# Patient Record
Sex: Female | Born: 1975 | Race: White | Hispanic: No | Marital: Single | Smoking: Former smoker
Health system: Southern US, Community
[De-identification: ages and names within clinical notes are randomized; demographics above are authoritative.]

## PROBLEM LIST (undated history)

## (undated) DIAGNOSIS — J302 Other seasonal allergic rhinitis: Secondary | ICD-10-CM

## (undated) DIAGNOSIS — Z87891 Personal history of nicotine dependence: Secondary | ICD-10-CM

## (undated) DIAGNOSIS — Z8489 Family history of other specified conditions: Secondary | ICD-10-CM

## (undated) DIAGNOSIS — E162 Hypoglycemia, unspecified: Secondary | ICD-10-CM

## (undated) DIAGNOSIS — Z87442 Personal history of urinary calculi: Secondary | ICD-10-CM

## (undated) DIAGNOSIS — N83202 Unspecified ovarian cyst, left side: Secondary | ICD-10-CM

## (undated) DIAGNOSIS — Z9889 Other specified postprocedural states: Secondary | ICD-10-CM

## (undated) DIAGNOSIS — N83201 Unspecified ovarian cyst, right side: Secondary | ICD-10-CM

## (undated) DIAGNOSIS — R112 Nausea with vomiting, unspecified: Secondary | ICD-10-CM

## (undated) DIAGNOSIS — K219 Gastro-esophageal reflux disease without esophagitis: Secondary | ICD-10-CM

## (undated) DIAGNOSIS — R51 Headache: Secondary | ICD-10-CM

## (undated) HISTORY — PX: AUGMENTATION MAMMAPLASTY: SUR837

## (undated) HISTORY — DX: Personal history of nicotine dependence: Z87.891

## (undated) HISTORY — DX: Other seasonal allergic rhinitis: J30.2

---

## 2000-09-29 ENCOUNTER — Ambulatory Visit (HOSPITAL_COMMUNITY): Admission: RE | Admit: 2000-09-29 | Discharge: 2000-09-29 | Payer: Self-pay | Admitting: Internal Medicine

## 2001-06-13 ENCOUNTER — Emergency Department (HOSPITAL_COMMUNITY): Admission: EM | Admit: 2001-06-13 | Discharge: 2001-06-14 | Payer: Self-pay | Admitting: Emergency Medicine

## 2001-06-14 ENCOUNTER — Encounter: Payer: Self-pay | Admitting: Emergency Medicine

## 2002-10-28 ENCOUNTER — Ambulatory Visit (HOSPITAL_COMMUNITY): Admission: RE | Admit: 2002-10-28 | Discharge: 2002-10-28 | Payer: Self-pay | Admitting: Orthopedic Surgery

## 2002-10-28 ENCOUNTER — Encounter: Payer: Self-pay | Admitting: Orthopedic Surgery

## 2003-10-28 ENCOUNTER — Other Ambulatory Visit: Admission: RE | Admit: 2003-10-28 | Discharge: 2003-10-28 | Payer: Self-pay | Admitting: Gynecology

## 2004-06-12 ENCOUNTER — Emergency Department (HOSPITAL_COMMUNITY): Admission: EM | Admit: 2004-06-12 | Discharge: 2004-06-12 | Payer: Self-pay | Admitting: Emergency Medicine

## 2004-10-29 ENCOUNTER — Other Ambulatory Visit: Admission: RE | Admit: 2004-10-29 | Discharge: 2004-10-29 | Payer: Self-pay | Admitting: Gynecology

## 2005-11-27 ENCOUNTER — Other Ambulatory Visit: Admission: RE | Admit: 2005-11-27 | Discharge: 2005-11-27 | Payer: Self-pay | Admitting: Gynecology

## 2006-12-03 ENCOUNTER — Other Ambulatory Visit: Admission: RE | Admit: 2006-12-03 | Discharge: 2006-12-03 | Payer: Self-pay | Admitting: Gynecology

## 2007-05-04 ENCOUNTER — Emergency Department (HOSPITAL_COMMUNITY): Admission: EM | Admit: 2007-05-04 | Discharge: 2007-05-04 | Payer: Self-pay | Admitting: Emergency Medicine

## 2009-12-20 ENCOUNTER — Emergency Department (HOSPITAL_COMMUNITY): Admission: EM | Admit: 2009-12-20 | Discharge: 2009-12-20 | Payer: Self-pay | Admitting: Family Medicine

## 2010-03-26 ENCOUNTER — Emergency Department (HOSPITAL_COMMUNITY): Admission: EM | Admit: 2010-03-26 | Discharge: 2010-03-26 | Payer: Self-pay | Admitting: Family Medicine

## 2010-06-22 ENCOUNTER — Emergency Department (HOSPITAL_COMMUNITY): Admission: EM | Admit: 2010-06-22 | Discharge: 2010-06-22 | Payer: Self-pay | Admitting: Emergency Medicine

## 2011-01-08 ENCOUNTER — Emergency Department (INDEPENDENT_AMBULATORY_CARE_PROVIDER_SITE_OTHER): Payer: Self-pay

## 2011-01-08 ENCOUNTER — Emergency Department (HOSPITAL_BASED_OUTPATIENT_CLINIC_OR_DEPARTMENT_OTHER)
Admission: EM | Admit: 2011-01-08 | Discharge: 2011-01-08 | Disposition: A | Discharge status: Home / Self Care | Payer: Self-pay | Attending: Emergency Medicine | Admitting: Emergency Medicine

## 2011-01-08 DIAGNOSIS — W19XXXA Unspecified fall, initial encounter: Secondary | ICD-10-CM | POA: Insufficient documentation

## 2011-01-08 DIAGNOSIS — Z043 Encounter for examination and observation following other accident: Secondary | ICD-10-CM

## 2011-01-08 DIAGNOSIS — R51 Headache: Secondary | ICD-10-CM | POA: Insufficient documentation

## 2011-01-08 DIAGNOSIS — S0990XA Unspecified injury of head, initial encounter: Secondary | ICD-10-CM | POA: Insufficient documentation

## 2011-01-08 DIAGNOSIS — R079 Chest pain, unspecified: Secondary | ICD-10-CM | POA: Insufficient documentation

## 2011-01-08 DIAGNOSIS — S1093XA Contusion of unspecified part of neck, initial encounter: Secondary | ICD-10-CM

## 2011-01-08 DIAGNOSIS — F1010 Alcohol abuse, uncomplicated: Secondary | ICD-10-CM

## 2011-01-08 DIAGNOSIS — M549 Dorsalgia, unspecified: Secondary | ICD-10-CM | POA: Insufficient documentation

## 2011-01-08 DIAGNOSIS — S0003XA Contusion of scalp, initial encounter: Secondary | ICD-10-CM

## 2011-01-08 LAB — URINE MICROSCOPIC-ADD ON

## 2011-01-08 LAB — CBC
HCT: 43.3 % (ref 36.0–46.0)
Hemoglobin: 14.9 g/dL (ref 12.0–15.0)
MCH: 29.9 pg (ref 26.0–34.0)
MCHC: 34.4 g/dL (ref 30.0–36.0)
Platelets: 235 10*3/uL (ref 150–400)
WBC: 7 10*3/uL (ref 4.0–10.5)

## 2011-01-08 LAB — URINALYSIS, ROUTINE W REFLEX MICROSCOPIC
Bilirubin Urine: NEGATIVE
Ketones, ur: NEGATIVE mg/dL
Leukocytes, UA: NEGATIVE
Protein, ur: NEGATIVE mg/dL
Urobilinogen, UA: 0.2 mg/dL (ref 0.0–1.0)
pH: 7 (ref 5.0–8.0)

## 2011-01-08 LAB — COMPREHENSIVE METABOLIC PANEL
ALT: 14 U/L (ref 0–35)
AST: 25 U/L (ref 0–37)
Albumin: 4.7 g/dL (ref 3.5–5.2)
CO2: 27 mEq/L (ref 19–32)
Calcium: 9.1 mg/dL (ref 8.4–10.5)
Chloride: 108 mEq/L (ref 96–112)
Creatinine, Ser: 0.8 mg/dL (ref 0.4–1.2)

## 2011-01-08 LAB — POCT TOXICOLOGY PANEL

## 2011-01-08 LAB — DIFFERENTIAL
Basophils Absolute: 0 10*3/uL (ref 0.0–0.1)
Basophils Relative: 0 % (ref 0–1)
Eosinophils Absolute: 0.1 10*3/uL (ref 0.0–0.7)
Eosinophils Relative: 2 % (ref 0–5)
Monocytes Absolute: 0.7 10*3/uL (ref 0.1–1.0)
Monocytes Relative: 10 % (ref 3–12)

## 2011-10-23 DIAGNOSIS — Z87891 Personal history of nicotine dependence: Secondary | ICD-10-CM | POA: Insufficient documentation

## 2011-10-24 ENCOUNTER — Ambulatory Visit (INDEPENDENT_AMBULATORY_CARE_PROVIDER_SITE_OTHER): Payer: Managed Care, Other (non HMO) | Admitting: Women's Health

## 2011-10-24 ENCOUNTER — Other Ambulatory Visit (HOSPITAL_COMMUNITY)
Admission: RE | Admit: 2011-10-24 | Discharge: 2011-10-24 | Disposition: A | Discharge status: Home / Self Care | Payer: Managed Care, Other (non HMO) | Source: Ambulatory Visit | Attending: Obstetrics and Gynecology | Admitting: Obstetrics and Gynecology

## 2011-10-24 ENCOUNTER — Encounter: Payer: Self-pay | Admitting: Women's Health

## 2011-10-24 VITALS — BP 128/80 | Ht 64.25 in | Wt 131.0 lb

## 2011-10-24 DIAGNOSIS — Z01419 Encounter for gynecological examination (general) (routine) without abnormal findings: Secondary | ICD-10-CM | POA: Insufficient documentation

## 2011-10-24 DIAGNOSIS — N926 Irregular menstruation, unspecified: Secondary | ICD-10-CM

## 2011-10-24 LAB — URINALYSIS W MICROSCOPIC + REFLEX CULTURE
Casts: NONE SEEN
Ketones, ur: NEGATIVE mg/dL
Leukocytes, UA: NEGATIVE
Protein, ur: NEGATIVE mg/dL
RBC / HPF: NONE SEEN RBC/hpf (ref <3)
Specific Gravity, Urine: 1.015 (ref 1.005–1.030)
Urobilinogen, UA: 0.2 mg/dL (ref 0.0–1.0)
WBC, UA: NONE SEEN WBC/hpf (ref <3)

## 2011-10-24 LAB — CBC WITH DIFFERENTIAL/PLATELET
Basophils Relative: 1 % (ref 0–1)
Eosinophils Relative: 2 % (ref 0–5)
Hemoglobin: 13.3 g/dL (ref 12.0–15.0)
Monocytes Absolute: 0.5 10*3/uL (ref 0.1–1.0)
Monocytes Relative: 8 % (ref 3–12)
Neutro Abs: 3.6 10*3/uL (ref 1.7–7.7)
Neutrophils Relative %: 57 % (ref 43–77)
Platelets: 211 10*3/uL (ref 150–400)
RBC: 4.38 MIL/uL (ref 3.87–5.11)
RDW: 12.6 % (ref 11.5–15.5)
WBC: 6.3 10*3/uL (ref 4.0–10.5)

## 2011-10-24 LAB — TSH: TSH: 1.256 u[IU]/mL (ref 0.350–4.500)

## 2011-10-24 NOTE — Progress Notes (Signed)
Leslie Keller Jan 29, 1976 [id number]    History:    The patient presents for annual exam.  Monthly 4-5 day cycle has used no contraception for greater than 1-1/2 years without pregnancy. Stopped  Nuva ring greater than 2 years ago, used condoms for one year, now desires conception. Has intercourse approximately 5-6 times per week, partner is healthy, has a 38 year old child. History of normal Paps, same partner many years with negative STD screen.  Past medical history, past surgical history, family history and social history were all reviewed and documented in the EPIC chart. Massage therapist part time, works full time in finance/insurance.   ROS:  A  ROS was performed and pertinent positives and negatives are included in the history.  Exam:  Filed Vitals:   10/24/11 1400  BP: 128/80    General appearance:  Normal Head/Neck:  Normal, without cervical or supraclavicular adenopathy. Thyroid:  Symmetrical, normal in size, without palpable masses or nodularity. Respiratory  Effort:  Normal  Auscultation:  Clear without wheezing or rhonchi Cardiovascular  Auscultation:  Regular rate, without rubs, murmurs or gallops  Edema/varicosities:  Not grossly evident Abdominal  Soft,nontender, without masses, guarding or rebound.  Liver/spleen:  No organomegaly noted  Hernia:  None appreciated  Skin  Inspection:  Grossly normal  Palpation:  Grossly normal Neurologic/psychiatric  Orientation:  Normal with appropriate conversation.  Mood/affect:  Normal  Genitourinary    Breasts: Examined lying and sitting/augmented.     Right: Without masses, retractions, discharge or axillary adenopathy.     Left: Lower/always Without masses, retractions, discharge or axillary adenopathy.   Inguinal/mons:  Normal without inguinal adenopathy  External genitalia:  Normal  BUS/Urethra/Skene's glands:  Normal  Bladder:  Normal  Vagina:  Normal  Cervix:  Normal  Uterus:   normal in size, shape and  contour.  Midline and mobile  Adnexa/parametria:     Rt: Without masses or tenderness.   Lt: Without masses or tenderness.  Anus and perineum: Normal  Digital rectal exam: Normal sphincter tone without palpated masses or tenderness  Assessment/Plan:  36 y.o. SWF G0 for annual exam/ regular 4-5d  monthly cycle.  Desiring pregnancy /No contraception for greater than 1 1/2 years without conception   Plan: CBC, TSH, prolactin, UA and Pap. Continue frequent intercourse, estradiol and FSH on day 3 of cycle, progesterone on day 22-25 of cycle. Reviewed if these tests are normal semen analysis and hysterosalpingogram. If all testing normal return for office visit with Dr. Audie Box for fertility management. Continue prenatal vitamin daily. Is aware of healthy behaviors in pregnancy. SBE's, exercise, calcium rich diet encouraged and continue healthy lifestyle.   Harrington Challenger [facility], 3:46 PM 10/24/2011

## 2011-10-24 NOTE — Patient Instructions (Addendum)
Return to office for estradiol and fsh blood test day 3 of next cycle  Then return day 22-25 for progesterone blood test  Day 1 first day of cycle  Usually ovulate 2 weeks prior to next cycle.    If all blood tests normal then will to [facility]

## 2011-11-07 ENCOUNTER — Other Ambulatory Visit: Payer: Managed Care, Other (non HMO)

## 2011-11-28 ENCOUNTER — Other Ambulatory Visit: Payer: Managed Care, Other (non HMO)

## 2011-11-28 LAB — PROGESTERONE: Progesterone: 10.6 ng/mL

## 2012-10-28 ENCOUNTER — Encounter: Payer: Self-pay | Admitting: Women's Health

## 2012-10-28 ENCOUNTER — Ambulatory Visit (INDEPENDENT_AMBULATORY_CARE_PROVIDER_SITE_OTHER): Payer: Managed Care, Other (non HMO) | Admitting: Women's Health

## 2012-10-28 VITALS — BP 106/70 | Ht 64.5 in | Wt 127.5 lb

## 2012-10-28 DIAGNOSIS — Z01419 Encounter for gynecological examination (general) (routine) without abnormal findings: Secondary | ICD-10-CM

## 2012-10-28 LAB — CBC WITH DIFFERENTIAL/PLATELET
Basophils Absolute: 0 10*3/uL (ref 0.0–0.1)
Eosinophils Absolute: 0.1 10*3/uL (ref 0.0–0.7)
HCT: 40 % (ref 36.0–46.0)
Lymphocytes Relative: 29 % (ref 12–46)
MCH: 31.3 pg (ref 26.0–34.0)
MCHC: 34.5 g/dL (ref 30.0–36.0)
MCV: 90.7 fL (ref 78.0–100.0)
Monocytes Absolute: 0.6 10*3/uL (ref 0.1–1.0)
Neutro Abs: 3.2 10*3/uL (ref 1.7–7.7)
Neutrophils Relative %: 60 % (ref 43–77)
RBC: 4.41 MIL/uL (ref 3.87–5.11)
RDW: 13.2 % (ref 11.5–15.5)
WBC: 5.4 10*3/uL (ref 4.0–10.5)

## 2012-10-28 LAB — RPR

## 2012-10-28 LAB — HIV ANTIBODY (ROUTINE TESTING W REFLEX): HIV: NONREACTIVE

## 2012-10-28 LAB — WET PREP FOR TRICH, YEAST, CLUE
Trich, Wet Prep: NONE SEEN
WBC, Wet Prep HPF POC: NONE SEEN

## 2012-10-28 MED ORDER — METRONIDAZOLE 0.75 % VA GEL: VAGINAL | Status: DC

## 2012-10-28 NOTE — Progress Notes (Signed)
Leslie Keller 11-Feb-1976 [id number]    History:    The patient presents for annual exam.  Monthly 4d cycle/condoms new partner. History of normal Paps.   Past medical history, past surgical history, family history and social history were all reviewed and documented in the EPIC chart. works for [employer]. Body building competitor. Quit smoking 2004.   ROS:  A  ROS was performed and pertinent positives and negatives are included in the history.  Exam:  Filed Vitals:   10/28/12 1222  BP: 106/70    General appearance:  Normal Head/Neck:  Normal, without cervical or supraclavicular adenopathy. Thyroid:  Symmetrical, normal in size, without palpable masses or nodularity. Respiratory  Effort:  Normal  Auscultation:  Clear without wheezing or rhonchi Cardiovascular  Auscultation:  Regular rate, without rubs, murmurs or gallops  Edema/varicosities:  Not grossly evident Abdominal  Soft,nontender, without masses, guarding or rebound.  Liver/spleen:  No organomegaly noted  Hernia:  None appreciated  Skin  Inspection:  Grossly normal  Palpation:  Grossly normal Neurologic/psychiatric  Orientation:  Normal with appropriate conversation.  Mood/affect:  Normal  Genitourinary    Breasts: Examined lying and sitting/saline implants/06.     Right: Without masses, retractions, discharge or axillary adenopathy.     Left: Without masses, retractions, discharge or axillary adenopathy.   Inguinal/mons:  Normal without inguinal adenopathy  External genitalia:  Normal  BUS/Urethra/Skene's glands:  Normal  Bladder:  Normal  Vagina:  Wet prep positive for clue cells and TNTC bacteria.  Cervix:  Normal  Uterus:   normal in size, shape and contour.  Midline and mobile  Adnexa/parametria:     Rt: Without masses or tenderness.   Lt: Without masses or tenderness.  Anus and perineum: Normal  Digital rectal exam: Normal sphincter tone without palpated masses or  tenderness  Assessment/Plan:  37 y.o. SWF G0 for annual exam with complaint of vaginal discharge with odor at times.  BV STD screen  Plan: Contraception options reviewed and declined, will continue condoms, pregnancy okay if occurs. SBE's, continue exercise, calcium rich diet,  multivitamin daily encouraged. CBC, UA, GC/Chlamydia, HIV, hepatitis B., C, RPR. Pap normal 2013, new screening guidelines reviewed. MetroGel vaginal cream 1 applicator at bedtime x5 prescription, alcohol precautions and proper use reviewed. Instructed to call if no relief of symptoms.   Harrington Challenger WHNP, 1:03 PM 10/28/2012

## 2012-10-28 NOTE — Patient Instructions (Addendum)

## 2012-10-29 LAB — URINALYSIS W MICROSCOPIC + REFLEX CULTURE
Bilirubin Urine: NEGATIVE
Casts: NONE SEEN
Glucose, UA: NEGATIVE mg/dL
Hgb urine dipstick: NEGATIVE
Leukocytes, UA: NEGATIVE
Protein, ur: NEGATIVE mg/dL
Urobilinogen, UA: 0.2 mg/dL (ref 0.0–1.0)
pH: 7.5 (ref 5.0–8.0)

## 2013-03-19 ENCOUNTER — Emergency Department (HOSPITAL_BASED_OUTPATIENT_CLINIC_OR_DEPARTMENT_OTHER): Payer: Managed Care, Other (non HMO)

## 2013-03-19 ENCOUNTER — Emergency Department (HOSPITAL_BASED_OUTPATIENT_CLINIC_OR_DEPARTMENT_OTHER)
Admission: EM | Admit: 2013-03-19 | Discharge: 2013-03-19 | Disposition: A | Discharge status: Home / Self Care | Payer: Managed Care, Other (non HMO) | Attending: Emergency Medicine | Admitting: Emergency Medicine

## 2013-03-19 ENCOUNTER — Encounter (HOSPITAL_BASED_OUTPATIENT_CLINIC_OR_DEPARTMENT_OTHER): Payer: Self-pay | Admitting: *Deleted

## 2013-03-19 DIAGNOSIS — N949 Unspecified condition associated with female genital organs and menstrual cycle: Secondary | ICD-10-CM | POA: Insufficient documentation

## 2013-03-19 DIAGNOSIS — R350 Frequency of micturition: Secondary | ICD-10-CM | POA: Insufficient documentation

## 2013-03-19 DIAGNOSIS — Z87891 Personal history of nicotine dependence: Secondary | ICD-10-CM | POA: Insufficient documentation

## 2013-03-19 DIAGNOSIS — R112 Nausea with vomiting, unspecified: Secondary | ICD-10-CM | POA: Insufficient documentation

## 2013-03-19 DIAGNOSIS — R3915 Urgency of urination: Secondary | ICD-10-CM | POA: Insufficient documentation

## 2013-03-19 DIAGNOSIS — N390 Urinary tract infection, site not specified: Secondary | ICD-10-CM | POA: Insufficient documentation

## 2013-03-19 DIAGNOSIS — N760 Acute vaginitis: Secondary | ICD-10-CM | POA: Insufficient documentation

## 2013-03-19 DIAGNOSIS — R300 Dysuria: Secondary | ICD-10-CM | POA: Insufficient documentation

## 2013-03-19 DIAGNOSIS — Z8742 Personal history of other diseases of the female genital tract: Secondary | ICD-10-CM | POA: Insufficient documentation

## 2013-03-19 DIAGNOSIS — N898 Other specified noninflammatory disorders of vagina: Secondary | ICD-10-CM | POA: Insufficient documentation

## 2013-03-19 DIAGNOSIS — B9689 Other specified bacterial agents as the cause of diseases classified elsewhere: Secondary | ICD-10-CM

## 2013-03-19 DIAGNOSIS — R3919 Other difficulties with micturition: Secondary | ICD-10-CM | POA: Insufficient documentation

## 2013-03-19 HISTORY — DX: Unspecified ovarian cyst, right side: N83.202

## 2013-03-19 HISTORY — DX: Unspecified ovarian cyst, right side: N83.201

## 2013-03-19 LAB — URINALYSIS, ROUTINE W REFLEX MICROSCOPIC
Glucose, UA: NEGATIVE mg/dL
Nitrite: NEGATIVE
Protein, ur: 100 mg/dL — AB
Urobilinogen, UA: 1 mg/dL (ref 0.0–1.0)

## 2013-03-19 LAB — URINE MICROSCOPIC-ADD ON

## 2013-03-19 LAB — WET PREP, GENITAL: Trich, Wet Prep: NONE SEEN

## 2013-03-19 MED ORDER — PROMETHAZINE HCL 25 MG PO TABS
25.0000 mg | ORAL_TABLET | ORAL | Status: AC
  Administered 2013-03-19: 25 mg via ORAL
  Filled 2013-03-19: qty 1

## 2013-03-19 MED ORDER — METRONIDAZOLE 500 MG PO TABS: 500.0000 mg | ORAL_TABLET | Freq: Two times a day (BID) | ORAL | Status: DC

## 2013-03-19 MED ORDER — CEPHALEXIN 500 MG PO CAPS: 500.0000 mg | ORAL_CAPSULE | Freq: Four times a day (QID) | ORAL | Status: DC

## 2013-03-19 MED ORDER — ONDANSETRON 8 MG PO TBDP
8.0000 mg | ORAL_TABLET | Freq: Once | ORAL | Status: AC
  Administered 2013-03-19: 8 mg via ORAL
  Filled 2013-03-19: qty 1

## 2013-03-19 MED ORDER — OXYCODONE-ACETAMINOPHEN 5-325 MG PO TABS
2.0000 | ORAL_TABLET | Freq: Once | ORAL | Status: AC
  Administered 2013-03-19: 2 via ORAL
  Filled 2013-03-19 (×2): qty 2

## 2013-03-19 NOTE — ED Notes (Signed)
Pt. Friends at bedside asking for Pt. To have crackers.  RN explained to all in room and Pt. That no crackers till MD gives results.

## 2013-03-19 NOTE — ED Notes (Signed)
Pt. Is in ultra sound at present time.

## 2013-03-19 NOTE — ED Notes (Signed)
Pt c/o lower abd pressure type pain x2-3 days with some pink discharge. She sts one episode of heavy vaginal bleeding today. She also reports the urge to urinate but doesn't feel like she is emptying her bladder.

## 2013-03-19 NOTE — ED Notes (Signed)
Pt. Reports she has a driver

## 2013-03-19 NOTE — ED Provider Notes (Signed)
History    CSN: [id number] Arrival date & time 03/19/13  1911  First MD Initiated Contact with Patient 03/19/13 02/04/2003     Chief Complaint  Patient presents with  . Abdominal Pain   (Consider location/radiation/quality/duration/timing/severity/associated sxs/prior Treatment) HPI Comments: Patient is a 37 year old female who presents today with 2 days of lower abdominal pain. She describes the pain as a pressure in her suprapubic area. The pain does not radiate. Sitting at work and made her pain feel worse. She feels like she has to urinate, but does not. She has a pink discharge coming from her vagina yesterday. Today she had spotting of blood. Last menstrual period was 2 weeks ago. 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 She reports a history of having several cysts that have ruptured. She has had several of them taken off with laser therapy. She reports nausea and vomited prior to arrival. No fevers, chills, back pain, upper abdominal pain, shortness of breath, chest pain. The history is provided by the patient. No language interpreter was used.   Past Medical History  Diagnosis Date  . Former smoker     QUIT 02/04/03  . Seasonal allergies   . Bilateral ovarian cysts    Past Surgical History  Procedure Laterality Date  . Augmentation mammaplasty  [id number]   Family History  Problem Relation Age of Onset  . Heart failure Father     DIED 02-03-09  . Other Maternal Grandmother     BLOOD DISORDER  . Other Paternal Grandmother     ALZHEIMERS   History  Substance Use Topics  . Smoking status: Former Games developer  . Smokeless tobacco: Never Used  . Alcohol Use: No     Comment: SOCIALLY ONLY   OB History   Grav Para Term Preterm Abortions TAB SAB Ect Mult Living   1    1 1          Review of Systems  Constitutional: Negative for fever and chills.  Respiratory: Negative for shortness of breath.   Cardiovascular: Negative for chest pain.   Gastrointestinal: Positive for nausea and vomiting. Negative for abdominal pain.  Genitourinary: Positive for dysuria, urgency, frequency, vaginal bleeding, vaginal discharge, difficulty urinating and pelvic pain.  All other systems reviewed and are negative.    Allergies  Review of patient's allergies indicates no known allergies.  Home Medications   Current Outpatient Rx  Name  Route  Sig  Dispense  Refill  . CALCIUM PO   Oral   Take by mouth.         . Ibuprofen 200 MG CAPS   Oral   Take by mouth.         . metroNIDAZOLE (METROGEL VAGINAL) 0.75 % vaginal gel      1 applicator per vagina at HS x 5   70 g   0   . Multiple Vitamin (MULTIVITAMIN) capsule   Oral   Take 1 capsule by mouth daily.          BP 144/87  Pulse 80  Temp(Src) 97.7 F (36.5 C) (Oral)  Resp 16  Ht 5\' 4"  (1.626 m)  Wt 125 lb 5 oz (56.841 kg)  BMI 21.5 kg/m2  SpO2 100%  LMP 03/05/2013 Physical Exam  Nursing note and vitals reviewed. Constitutional: She is oriented to person, place, and time. She appears well-developed and well-nourished. No distress.  HENT:  Head: Normocephalic and atraumatic.  Right Ear: External ear normal.  Left  Ear: External ear normal.  Nose: Nose normal.  Mouth/Throat: Oropharynx is clear and moist.  Eyes: Conjunctivae are normal.  Neck: Normal range of motion.  Cardiovascular: Normal rate, regular rhythm and normal heart sounds.   Pulmonary/Chest: Effort normal and breath sounds normal. No stridor. No respiratory distress. She has no wheezes. She has no rales.  Abdominal: Soft. Normal appearance. She exhibits no distension. There is tenderness (mild) in the suprapubic area. There is no rigidity, no rebound, no guarding and no CVA tenderness. Hernia confirmed negative in the right inguinal area and confirmed negative in the left inguinal area.  Genitourinary: Vagina normal. Pelvic exam was performed with patient supine. No labial fusion. There is no rash,  tenderness, lesion or injury on the right labia. There is no rash, tenderness, lesion or injury on the left labia. Cervix exhibits discharge (white discharge). Cervix exhibits no motion tenderness and no friability. Right adnexum displays no mass, no tenderness and no fullness. Left adnexum displays no mass, no tenderness and no fullness.  No blood  Musculoskeletal: Normal range of motion.  Lymphadenopathy:       Right: No inguinal adenopathy present.       Left: No inguinal adenopathy present.  Neurological: She is alert and oriented to person, place, and time. She has normal strength.  Skin: Skin is warm and dry. She is not diaphoretic. No erythema.  Psychiatric: She has a normal mood and affect. Her behavior is normal.    ED Course  Procedures (including critical care time) Labs Reviewed  WET PREP, GENITAL - Abnormal; Notable for the following:    Clue Cells Wet Prep HPF POC FEW (*)    All other components within normal limits  URINALYSIS, ROUTINE W REFLEX MICROSCOPIC - Abnormal; Notable for the following:    APPearance TURBID (*)    Hgb urine dipstick LARGE (*)    Protein, ur 100 (*)    Leukocytes, UA LARGE (*)    All other components within normal limits  URINE MICROSCOPIC-ADD ON - Abnormal; Notable for the following:    Squamous Epithelial / LPF FEW (*)    Bacteria, UA MANY (*)    All other components within normal limits  GC/CHLAMYDIA PROBE AMP  URINE CULTURE   US Transvaginal Non-ob  03/19/2013   *RADIOLOGY REPORT*  Clinical Data: Lower abdominal pain and nausea.  Symptoms for 2 days.  History of ovarian cysts.  TRANSABDOMINAL AND TRANSVAGINAL ULTRASOUND OF PELVIS Technique:  Both transabdominal and transvaginal ultrasound examinations of the pelvis were performed. Transabdominal technique was performed for global imaging of the pelvis including uterus, ovaries, adnexal regions, and pelvic cul-de-sac.  It was necessary to proceed with endovaginal exam following the  transabdominal exam to visualize the ovaries and endometrium.  Comparison:  None  Findings:  Uterus: The uterus is anteverted and measures 6.3 x 3.4 x 4.3 cm. Normal homogeneous parenchymal echotexture.  No focal mass lesions identified.  Small Nabothian cysts in the cervix.  Endometrium: Normal endometrial stripe thickness measured at 6 mm. No abnormal endometrial fluid collections.  Right ovary:  Right ovary measures 2.7 x 1.5 x 2.2 cm.  The normal follicular changes.  Flow is demonstrated in the right ovary on color flow Doppler imaging.  No abnormal adnexal masses.  Left ovary: Left ovary measures 3.4 x 1.9 x 2.3 cm.  Normal follicular changes.  Flow is demonstrated in the left ovary on color flow Doppler imaging.  No abnormal adnexal masses.  Other findings: No free fluid  IMPRESSION: Normal study. No evidence of pelvic mass or other significant abnormality.   Original Report Authenticated By: Burman Nieves, M.D.   US Pelvis Complete  03/19/2013   *RADIOLOGY REPORT*  Clinical Data: Lower abdominal pain and nausea.  Symptoms for 2 days.  History of ovarian cysts.  TRANSABDOMINAL AND TRANSVAGINAL ULTRASOUND OF PELVIS Technique:  Both transabdominal and transvaginal ultrasound examinations of the pelvis were performed. Transabdominal technique was performed for global imaging of the pelvis including uterus, ovaries, adnexal regions, and pelvic cul-de-sac.  It was necessary to proceed with endovaginal exam following the transabdominal exam to visualize the ovaries and endometrium.  Comparison:  None  Findings:  Uterus: The uterus is anteverted and measures 6.3 x 3.4 x 4.3 cm. Normal homogeneous parenchymal echotexture.  No focal mass lesions identified.  Small Nabothian cysts in the cervix.  Endometrium: Normal endometrial stripe thickness measured at 6 mm. No abnormal endometrial fluid collections.  Right ovary:  Right ovary measures 2.7 x 1.5 x 2.2 cm.  The normal follicular changes.  Flow is demonstrated in  the right ovary on color flow Doppler imaging.  No abnormal adnexal masses.  Left ovary: Left ovary measures 3.4 x 1.9 x 2.3 cm.  Normal follicular changes.  Flow is demonstrated in the left ovary on color flow Doppler imaging.  No abnormal adnexal masses.  Other findings: No free fluid  IMPRESSION: Normal study. No evidence of pelvic mass or other significant abnormality.   Original Report Authenticated By: Burman Nieves, M.D.   1. Urinary tract infection   2. Bacterial vaginosis     MDM  Pt has been diagnosed with a UTI. Pt is afebrile, no CVA tenderness, normotensive. Patient also dx'd with BV. Rx for Flagyl. [place] negative for cyst, torsion. Pregnancy negative. Pt to be dc home with antibiotics and instructions to follow up with PCP if symptoms persist. Return instructions given. Vital signs stable for discharge. Patient / Family / Caregiver informed of clinical course, understand medical decision-making process, and agree with plan.    Mora Bellman, PA-C 03/21/13 [phone]

## 2013-03-20 LAB — GC/CHLAMYDIA PROBE AMP
CT Probe RNA: NEGATIVE
GC Probe RNA: NEGATIVE

## 2013-03-22 LAB — URINE CULTURE: Colony Count: >=100000

## 2013-03-23 ENCOUNTER — Telehealth (HOSPITAL_COMMUNITY): Payer: Self-pay | Admitting: *Deleted

## 2013-03-23 NOTE — Progress Notes (Signed)
ED Antimicrobial Stewardship Positive Culture Follow Up   Leslie Keller is an 37 y.o. female who presented to [facility] on 03/19/2013 with a chief complaint of  Chief Complaint  Patient presents with  . Abdominal Pain    Recent Results (from the past 720 hour(s))  URINE CULTURE     Status: None   Collection Time    03/19/13  7:31 PM      Result Value Range Status   Specimen Description URINE, CLEAN CATCH   Final   Special Requests NONE   Final   Culture  Setup Time 03/19/2013 22:53   Final   Colony Count >=100,000 COLONIES/ML   Final   Culture ESCHERICHIA COLI   Final   Report Status 03/22/2013 FINAL   Final   Organism ID, Bacteria ESCHERICHIA COLI   Final  WET PREP, GENITAL     Status: Abnormal   Collection Time    03/19/13  9:09 PM      Result Value Range Status   Yeast Wet Prep HPF POC NONE SEEN  NONE SEEN Final   Trich, Wet Prep NONE SEEN  NONE SEEN Final   Clue Cells Wet Prep HPF POC FEW (*) NONE SEEN Final   WBC, Wet Prep HPF POC NONE SEEN  NONE SEEN Final  GC/CHLAMYDIA PROBE AMP     Status: None   Collection Time    03/19/13  9:09 PM      Result Value Range Status   CT Probe RNA NEGATIVE  NEGATIVE Final   GC Probe RNA NEGATIVE  NEGATIVE Final   Comment: (NOTE)                                                                                              **Normal Reference Range: Negative**          Assay performed using the Gen-Probe APTIMA COMBO2 (R) Assay.     Acceptable specimen types for this assay include APTIMA Swabs (Unisex,     endocervical, urethral, or vaginal), first void urine, and ThinPrep     liquid based cytology samples.    [x]  Treated with cephalexin, organism resistant to prescribed antimicrobial []  Patient discharged originally without antimicrobial agent and treatment is now indicated  New antibiotic prescription: Bactrim DS 1 tablet BID x 3 days  ED Provider: Junious Silk, PAC   [name] 03/23/2013, 11:44 AM Infectious  Diseases Pharmacist Phone# [phone]

## 2013-03-31 NOTE — ED Provider Notes (Signed)
Medical screening examination/treatment/procedure(s) were performed by non-physician practitioner and as supervising physician I was immediately available for consultation/collaboration.   Carleene Cooper III, MD 03/31/13 1153

## 2014-04-13 ENCOUNTER — Other Ambulatory Visit: Payer: Self-pay | Admitting: Gastroenterology

## 2014-04-13 DIAGNOSIS — R109 Unspecified abdominal pain: Secondary | ICD-10-CM

## 2014-04-18 ENCOUNTER — Ambulatory Visit
Admission: RE | Admit: 2014-04-18 | Discharge: 2014-04-18 | Disposition: A | Discharge status: Home / Self Care | Payer: Managed Care, Other (non HMO) | Source: Ambulatory Visit | Attending: Gastroenterology | Admitting: Gastroenterology

## 2014-04-18 DIAGNOSIS — R109 Unspecified abdominal pain: Secondary | ICD-10-CM

## 2014-04-25 ENCOUNTER — Other Ambulatory Visit: Payer: Self-pay | Admitting: Gastroenterology

## 2014-04-26 ENCOUNTER — Encounter (INDEPENDENT_AMBULATORY_CARE_PROVIDER_SITE_OTHER): Payer: Self-pay

## 2014-04-26 ENCOUNTER — Ambulatory Visit (INDEPENDENT_AMBULATORY_CARE_PROVIDER_SITE_OTHER): Payer: Managed Care, Other (non HMO) | Admitting: General Surgery

## 2014-04-26 ENCOUNTER — Encounter (INDEPENDENT_AMBULATORY_CARE_PROVIDER_SITE_OTHER): Payer: Self-pay | Admitting: General Surgery

## 2014-04-26 VITALS — BP 102/68 | HR 66 | Temp 97.1°F | Resp 16 | Ht 65.0 in | Wt 135.0 lb

## 2014-04-26 DIAGNOSIS — K8020 Calculus of gallbladder without cholecystitis without obstruction: Secondary | ICD-10-CM

## 2014-04-26 NOTE — Progress Notes (Signed)
Patient ID: Leslie Keller, female   DOB: 15-May-1976, 38 y.o.   MRN: [id number]  No chief complaint on file.   HPI Leslie Keller is a 38 y.o. female.  The patient is a 38 year old female who is referred by Dr. Evette Cristal for an evaluation of symptomatic cholelithiasis and gallbladder polyp. The patient states she's been having abdomihe'snal pain for the last 9 years. Ultrasound revealed a 1.3 cm gallbladder polyp.  The patient states she has been center low-fat diet. She states that the pain began to whether or not she is having a low-fat diet or not.   The patient is a shows Gilbert's syndrome. HPI  Past Medical History  Diagnosis Date  . Former smoker     QUIT 2003/02/13  . Seasonal allergies   . Bilateral ovarian cysts     Past Surgical History  Procedure Laterality Date  . Augmentation mammaplasty  [id number]    Family History  Problem Relation Age of Onset  . Heart failure Father     DIED 2009-02-12  . Other Maternal Grandmother     BLOOD DISORDER  . Other Paternal Grandmother     ALZHEIMERS    Social History History  Substance Use Topics  . Smoking status: Former Games developer  . Smokeless tobacco: Never Used  . Alcohol Use: No     Comment: SOCIALLY ONLY    No Known Allergies  Current Outpatient Prescriptions  Medication Sig Dispense Refill  . CALCIUM PO Take by mouth.      . dicyclomine (BENTYL) 20 MG tablet       . Ibuprofen 200 MG CAPS Take by mouth.      . metroNIDAZOLE (METROGEL VAGINAL) 0.75 % vaginal gel 1 applicator per vagina at HS x 5  70 g  0  . Multiple Vitamin (MULTIVITAMIN) capsule Take 1 capsule by mouth daily.      . valACYclovir (VALTREX) 1000 MG tablet       . cephALEXin (KEFLEX) 500 MG capsule Take 1 capsule (500 mg total) by mouth 4 (four) times daily.  40 capsule  0  . metroNIDAZOLE (FLAGYL) 500 MG tablet Take 1 tablet (500 mg total) by mouth 2 (two) times daily. One po bid x 7 days  14 tablet  0   No current facility-administered medications for this  visit.    Review of Systems Review of Systems  Constitutional: Negative.   HENT: Negative.   Respiratory: Negative.   Cardiovascular: Negative.   Gastrointestinal: Positive for abdominal pain.  Neurological: Negative.   All other systems reviewed and are negative.   Blood pressure 102/68, pulse 66, temperature 97.1 F (36.2 C), temperature source Temporal, resp. rate 16, height 5\' 5"  (1.651 m), weight 135 lb (61.236 kg).  Physical Exam Physical Exam  Constitutional: She is oriented to person, place, and time. She appears well-developed and well-nourished.  HENT:  Head: Normocephalic and atraumatic.  Eyes: Conjunctivae and EOM are normal. Pupils are equal, round, and reactive to light.  Neck: Normal range of motion. Neck supple.  Cardiovascular: Normal rate, regular rhythm and normal heart sounds.   Pulmonary/Chest: Effort normal and breath sounds normal.  Abdominal: Soft. Bowel sounds are normal. She exhibits no distension. There is tenderness (RUQ). There is no rebound and no guarding.  Musculoskeletal: Normal range of motion.  Neurological: She is alert and oriented to person, place, and time.  Skin: Skin is warm and dry.  Psychiatric: She has a normal mood and affect.    Data Reviewed Ultrasound as  above  Assessment    38 year old female sent by cholelithiasis and gallbladder polyp.     Plan    1. We will proceed to the operating room for laparoscopic cholecystectomy. 2.All risks and benefits were discussed with the patient to generally include: infection, bleeding, possible need for post op ERCP, damage to the bile ducts, and bile leak. Alternatives were offered and described.  All questions were answered and the patient voiced understanding of the procedure and wishes to proceed at this point with a laparoscopic cholecystectomy         Marigene EhlersRamirez Jr., [facility] 04/26/2014, 9:29 AM

## 2014-04-26 NOTE — Addendum Note (Signed)
Addended by: Axel FillerAMIREZ, Laynie Espy on: 04/26/2014 09:35 AM   Modules accepted: Orders

## 2014-05-25 ENCOUNTER — Encounter (HOSPITAL_COMMUNITY)
Admission: RE | Admit: 2014-05-25 | Discharge: 2014-05-25 | Disposition: A | Discharge status: Home / Self Care | Payer: Managed Care, Other (non HMO) | Source: Ambulatory Visit | Attending: General Surgery | Admitting: General Surgery

## 2014-05-25 ENCOUNTER — Encounter (HOSPITAL_COMMUNITY): Payer: Self-pay

## 2014-05-25 DIAGNOSIS — Z01812 Encounter for preprocedural laboratory examination: Secondary | ICD-10-CM | POA: Insufficient documentation

## 2014-05-25 DIAGNOSIS — K8020 Calculus of gallbladder without cholecystitis without obstruction: Secondary | ICD-10-CM | POA: Insufficient documentation

## 2014-05-25 HISTORY — DX: Gastro-esophageal reflux disease without esophagitis: K21.9

## 2014-05-25 HISTORY — DX: Nausea with vomiting, unspecified: Z98.890

## 2014-05-25 HISTORY — DX: Personal history of urinary calculi: Z87.442

## 2014-05-25 HISTORY — DX: Hypoglycemia, unspecified: E16.2

## 2014-05-25 HISTORY — DX: Headache: R51

## 2014-05-25 HISTORY — DX: Nausea with vomiting, unspecified: R11.2

## 2014-05-25 LAB — CBC
HCT: 40.4 % (ref 36.0–46.0)
Hemoglobin: 13.4 g/dL (ref 12.0–15.0)
MCH: 30.2 pg (ref 26.0–34.0)
MCHC: 33.2 g/dL (ref 30.0–36.0)
MCV: 91.2 fL (ref 78.0–100.0)
PLATELETS: 187 10*3/uL (ref 150–400)
RBC: 4.43 MIL/uL (ref 3.87–5.11)
RDW: 12.4 % (ref 11.5–15.5)
WBC: 6.1 10*3/uL (ref 4.0–10.5)

## 2014-05-25 LAB — BASIC METABOLIC PANEL
ANION GAP: 8 (ref 5–15)
BUN: 18 mg/dL (ref 6–23)
CALCIUM: 9.3 mg/dL (ref 8.4–10.5)
CO2: 29 meq/L (ref 19–32)
CREATININE: 0.88 mg/dL (ref 0.50–1.10)
Chloride: 100 mEq/L (ref 96–112)
GFR calc non Af Amer: 82 mL/min — ABNORMAL LOW (ref >90)
Glucose, Bld: 97 mg/dL (ref 70–99)
Potassium: 4 mEq/L (ref 3.7–5.3)
Sodium: 137 mEq/L (ref 137–147)

## 2014-05-25 LAB — HCG, SERUM, QUALITATIVE: Preg, Serum: NEGATIVE

## 2014-05-25 NOTE — Pre-Procedure Instructions (Signed)
Leslie Keller  05/25/2014   Your procedure is scheduled on:  Tuesday  05/31/14  Report to [facility] Admitting at 720 AM.  Call this number if you have problems the morning of surgery: [phone]   Remember:   Do not eat food or drink liquids after midnight.   Take these medicines the morning of surgery with A SIP OF WATER: NONE   Do not wear jewelry, make-up or nail polish.  Do not wear lotions, powders, or perfumes. You may wear deodorant.  Do not shave 48 hours prior to surgery. Men may shave face and neck.  Do not bring valuables to the hospital.  [facility] is not responsible                  for any belongings or valuables.               Contacts, dentures or bridgework may not be worn into surgery.  Leave suitcase in the car. After surgery it may be brought to your room.  For patients admitted to the hospital, discharge time is determined by your                treatment team.               Patients discharged the day of surgery will not be allowed to drive  home.  Name and phone number of your driver:   Special Instructions:  Special Instructions: [facility] - Preparing for Surgery  Before surgery, you can play an important role.  Because skin is not sterile, your skin needs to be as free of germs as possible.  You can reduce the number of germs on you skin by washing with CHG (chlorahexidine gluconate) soap before surgery.  CHG is an antiseptic cleaner which kills germs and bonds with the skin to continue killing germs even after washing.  Please DO NOT use if you have an allergy to CHG or antibacterial soaps.  If your skin becomes reddened/irritated stop using the CHG and inform your nurse when you arrive at Short Stay.  Do not shave (including legs and underarms) for at least 48 hours prior to the first CHG shower.  You may shave your face.  Please follow these instructions carefully:   1.  Shower with CHG Soap the night before surgery and the morning of  Surgery.  2.  If you choose to wash your hair, wash your hair first as usual with your normal shampoo.  3.  After you shampoo, rinse your hair and body thoroughly to remove the Shampoo.  4.  Use CHG as you would any other liquid soap. You can apply chg directly to the skin and wash gently with scrungie or a clean washcloth.  5.  Apply the CHG Soap to your body ONLY FROM THE NECK DOWN.  Do not use on open wounds or open sores.  Avoid contact with your eyes, ears, mouth and genitals (private parts).  Wash genitals (private parts with your normal soap.  6.  Wash thoroughly, paying special attention to the area where your surgery will be performed.  7.  Thoroughly rinse your body with warm water from the neck down.  8.  DO NOT shower/wash with your normal soap after using and rinsing off the CHG Soap.  9.  Pat yourself dry with a clean towel.            10.  Wear clean pajamas.  11.  Place clean sheets on your bed the night of your first shower and do not sleep with pets.  Day of Surgery  Do not apply any lotions/deodorants the morning of surgery.  Please wear clean clothes to the hospital/surgery center.   Please read over the following fact sheets that you were given: Pain Booklet, Coughing and Deep Breathing and Surgical Site Infection Prevention

## 2014-05-30 MED ORDER — CEFAZOLIN SODIUM-DEXTROSE 2-3 GM-% IV SOLR
2.0000 g | INTRAVENOUS | Status: DC
  Filled 2014-05-30: qty 50

## 2014-05-31 ENCOUNTER — Ambulatory Visit (HOSPITAL_COMMUNITY): Payer: Managed Care, Other (non HMO) | Admitting: Anesthesiology

## 2014-05-31 ENCOUNTER — Encounter (HOSPITAL_COMMUNITY): Payer: Self-pay

## 2014-05-31 ENCOUNTER — Encounter (HOSPITAL_COMMUNITY)
Admission: RE | Disposition: A | Discharge status: Home / Self Care | Payer: Self-pay | Source: Ambulatory Visit | Attending: General Surgery

## 2014-05-31 ENCOUNTER — Encounter (HOSPITAL_COMMUNITY): Payer: Managed Care, Other (non HMO) | Admitting: Anesthesiology

## 2014-05-31 ENCOUNTER — Ambulatory Visit (HOSPITAL_COMMUNITY)
Admission: RE | Admit: 2014-05-31 | Discharge: 2014-05-31 | Disposition: A | Discharge status: Home / Self Care | Payer: Managed Care, Other (non HMO) | Source: Ambulatory Visit | Attending: General Surgery | Admitting: General Surgery

## 2014-05-31 DIAGNOSIS — Z87891 Personal history of nicotine dependence: Secondary | ICD-10-CM | POA: Insufficient documentation

## 2014-05-31 DIAGNOSIS — K8010 Calculus of gallbladder with chronic cholecystitis without obstruction: Secondary | ICD-10-CM | POA: Diagnosis not present

## 2014-05-31 DIAGNOSIS — K8020 Calculus of gallbladder without cholecystitis without obstruction: Secondary | ICD-10-CM | POA: Diagnosis present

## 2014-05-31 HISTORY — PX: CHOLECYSTECTOMY: SHX55

## 2014-05-31 HISTORY — DX: Family history of other specified conditions: Z84.89

## 2014-05-31 SURGERY — LAPAROSCOPIC CHOLECYSTECTOMY
Anesthesia: General | Site: Abdomen

## 2014-05-31 MED ORDER — KETOROLAC TROMETHAMINE 30 MG/ML IJ SOLN
15.0000 mg | Freq: Once | INTRAMUSCULAR | Status: AC | PRN
  Administered 2014-05-31: 30 mg via INTRAVENOUS

## 2014-05-31 MED ORDER — SODIUM CHLORIDE 0.9 % IR SOLN
Status: DC | PRN
  Administered 2014-05-31: 1000 mL

## 2014-05-31 MED ORDER — HYDROMORPHONE HCL 1 MG/ML IJ SOLN
INTRAMUSCULAR | Status: AC
  Filled 2014-05-31: qty 1

## 2014-05-31 MED ORDER — GLYCOPYRROLATE 0.2 MG/ML IJ SOLN
INTRAMUSCULAR | Status: DC | PRN
  Administered 2014-05-31: 0.6 mg via INTRAVENOUS

## 2014-05-31 MED ORDER — FENTANYL CITRATE 0.05 MG/ML IJ SOLN
INTRAMUSCULAR | Status: AC
  Filled 2014-05-31: qty 2

## 2014-05-31 MED ORDER — HYDROMORPHONE HCL 1 MG/ML IJ SOLN
INTRAMUSCULAR | Status: DC
Start: 2014-05-31 — End: 2014-05-31
  Filled 2014-05-31: qty 1

## 2014-05-31 MED ORDER — PROPOFOL 10 MG/ML IV BOLUS
INTRAVENOUS | Status: DC | PRN
  Administered 2014-05-31: 180 mg via INTRAVENOUS

## 2014-05-31 MED ORDER — CHLORHEXIDINE GLUCONATE 4 % EX LIQD
1.0000 "application " | Freq: Once | CUTANEOUS | Status: DC
  Filled 2014-05-31: qty 15

## 2014-05-31 MED ORDER — KETOROLAC TROMETHAMINE 30 MG/ML IJ SOLN
INTRAMUSCULAR | Status: AC
  Filled 2014-05-31: qty 1

## 2014-05-31 MED ORDER — FENTANYL CITRATE 0.05 MG/ML IJ SOLN
50.0000 ug | INTRAMUSCULAR | Status: DC | PRN
  Administered 2014-05-31 (×2): 50 ug via INTRAVENOUS

## 2014-05-31 MED ORDER — ROCURONIUM BROMIDE 100 MG/10ML IV SOLN
INTRAVENOUS | Status: DC | PRN
  Administered 2014-05-31: 40 mg via INTRAVENOUS

## 2014-05-31 MED ORDER — LACTATED RINGERS IV SOLN
INTRAVENOUS | Status: DC
  Administered 2014-05-31 (×2): via INTRAVENOUS

## 2014-05-31 MED ORDER — PROMETHAZINE HCL 25 MG/ML IJ SOLN
INTRAMUSCULAR | Status: AC
  Filled 2014-05-31: qty 1

## 2014-05-31 MED ORDER — BUPIVACAINE HCL 0.25 % IJ SOLN
INTRAMUSCULAR | Status: DC | PRN
  Administered 2014-05-31: 30 mL

## 2014-05-31 MED ORDER — 0.9 % SODIUM CHLORIDE (POUR BTL) OPTIME
TOPICAL | Status: DC | PRN
  Administered 2014-05-31: 1000 mL

## 2014-05-31 MED ORDER — DIPHENHYDRAMINE HCL 50 MG/ML IJ SOLN
INTRAMUSCULAR | Status: DC | PRN
  Administered 2014-05-31: 12.5 mg via INTRAVENOUS

## 2014-05-31 MED ORDER — PROMETHAZINE HCL 25 MG/ML IJ SOLN: 6.2500 mg | INTRAMUSCULAR | Status: DC | PRN

## 2014-05-31 MED ORDER — MIDAZOLAM HCL 5 MG/5ML IJ SOLN
INTRAMUSCULAR | Status: DC | PRN
  Administered 2014-05-31: 2 mg via INTRAVENOUS

## 2014-05-31 MED ORDER — FENTANYL CITRATE 0.05 MG/ML IJ SOLN
INTRAMUSCULAR | Status: AC
  Filled 2014-05-31: qty 5

## 2014-05-31 MED ORDER — MIDAZOLAM HCL 2 MG/2ML IJ SOLN
INTRAMUSCULAR | Status: AC
  Filled 2014-05-31: qty 2

## 2014-05-31 MED ORDER — NEOSTIGMINE METHYLSULFATE 10 MG/10ML IV SOLN
INTRAVENOUS | Status: DC | PRN
  Administered 2014-05-31: 4 mg via INTRAVENOUS

## 2014-05-31 MED ORDER — DEXAMETHASONE SODIUM PHOSPHATE 10 MG/ML IJ SOLN
INTRAMUSCULAR | Status: DC | PRN
  Administered 2014-05-31: 10 mg via INTRAVENOUS

## 2014-05-31 MED ORDER — ACETAMINOPHEN 10 MG/ML IV SOLN
INTRAVENOUS | Status: AC
  Filled 2014-05-31: qty 100

## 2014-05-31 MED ORDER — ONDANSETRON HCL 4 MG/2ML IJ SOLN
INTRAMUSCULAR | Status: DC | PRN
  Administered 2014-05-31: 4 mg via INTRAVENOUS

## 2014-05-31 MED ORDER — OXYCODONE-ACETAMINOPHEN 5-325 MG PO TABS: 1.0000 | ORAL_TABLET | ORAL | Status: DC | PRN

## 2014-05-31 MED ORDER — FENTANYL CITRATE 0.05 MG/ML IJ SOLN
INTRAMUSCULAR | Status: DC | PRN
Start: 2014-05-31 — End: 2014-05-31
  Administered 2014-05-31 (×4): 100 ug via INTRAVENOUS

## 2014-05-31 MED ORDER — PROPOFOL 10 MG/ML IV BOLUS
INTRAVENOUS | Status: AC
  Filled 2014-05-31: qty 20

## 2014-05-31 MED ORDER — HYDROMORPHONE HCL 1 MG/ML IJ SOLN
0.2500 mg | INTRAMUSCULAR | Status: DC | PRN
  Administered 2014-05-31 (×4): 0.5 mg via INTRAVENOUS

## 2014-05-31 MED ORDER — ACETAMINOPHEN 10 MG/ML IV SOLN
INTRAVENOUS | Status: DC | PRN
  Administered 2014-05-31: 1000 mg via INTRAVENOUS

## 2014-05-31 MED ORDER — LIDOCAINE HCL (CARDIAC) 20 MG/ML IV SOLN
INTRAVENOUS | Status: DC | PRN
  Administered 2014-05-31: 80 mg via INTRAVENOUS

## 2014-05-31 SURGICAL SUPPLY — 40 items
BENZOIN TINCTURE PRP APPL 2/3 (GAUZE/BANDAGES/DRESSINGS) ×2 IMPLANT
CANISTER SUCTION 2500CC (MISCELLANEOUS) ×2 IMPLANT
CHLORAPREP W/TINT 26ML (MISCELLANEOUS) ×2 IMPLANT
CLIP LIGATING HEMO O LOK GREEN (MISCELLANEOUS) ×4 IMPLANT
COVER SURGICAL LIGHT HANDLE (MISCELLANEOUS) ×2 IMPLANT
COVER TRANSDUCER ULTRASND (DRAPES) ×2 IMPLANT
DEVICE TROCAR PUNCTURE CLOSURE (ENDOMECHANICALS) ×2 IMPLANT
DRAPE UTILITY 15X26 W/TAPE STR (DRAPE) ×4 IMPLANT
ELECT REM PT RETURN 9FT ADLT (ELECTROSURGICAL) ×2
ELECTRODE REM PT RTRN 9FT ADLT (ELECTROSURGICAL) ×1 IMPLANT
GAUZE SPONGE 2X2 8PLY STRL LF (GAUZE/BANDAGES/DRESSINGS) ×1 IMPLANT
GLOVE BIO SURGEON STRL SZ7.5 (GLOVE) ×2 IMPLANT
GLOVE BIO SURGEON STRL SZ8 (GLOVE) ×2 IMPLANT
GLOVE BIOGEL PI IND STRL 7.0 (GLOVE) ×1 IMPLANT
GLOVE BIOGEL PI IND STRL 8 (GLOVE) ×1 IMPLANT
GLOVE BIOGEL PI INDICATOR 7.0 (GLOVE) ×1
GLOVE BIOGEL PI INDICATOR 8 (GLOVE) ×1
GLOVE SURG SS PI 7.0 STRL IVOR (GLOVE) ×2 IMPLANT
GOWN STRL REUS W/ TWL LRG LVL3 (GOWN DISPOSABLE) ×2 IMPLANT
GOWN STRL REUS W/ TWL XL LVL3 (GOWN DISPOSABLE) ×1 IMPLANT
GOWN STRL REUS W/TWL LRG LVL3 (GOWN DISPOSABLE) ×2
GOWN STRL REUS W/TWL XL LVL3 (GOWN DISPOSABLE) ×1
IV CATH 14GX2 1/4 (CATHETERS) ×2 IMPLANT
KIT BASIN OR (CUSTOM PROCEDURE TRAY) ×2 IMPLANT
KIT ROOM TURNOVER OR (KITS) ×2 IMPLANT
NEEDLE INSUFFLATION 14GA 120MM (NEEDLE) ×2 IMPLANT
NS IRRIG 1000ML POUR BTL (IV SOLUTION) ×2 IMPLANT
PAD ARMBOARD 7.5X6 YLW CONV (MISCELLANEOUS) ×4 IMPLANT
SCISSORS LAP 5X35 DISP (ENDOMECHANICALS) ×2 IMPLANT
SET IRRIG TUBING LAPAROSCOPIC (IRRIGATION / IRRIGATOR) ×2 IMPLANT
SLEEVE ENDOPATH XCEL 5M (ENDOMECHANICALS) ×2 IMPLANT
SPECIMEN JAR SMALL (MISCELLANEOUS) ×2 IMPLANT
SPONGE GAUZE 2X2 STER 10/PKG (GAUZE/BANDAGES/DRESSINGS) ×1
SUT MNCRL AB 3-0 PS2 18 (SUTURE) ×2 IMPLANT
TAPE CLOTH SURG 4X10 WHT LF (GAUZE/BANDAGES/DRESSINGS) ×2 IMPLANT
TOWEL OR 17X24 6PK STRL BLUE (TOWEL DISPOSABLE) ×2 IMPLANT
TOWEL OR 17X26 10 PK STRL BLUE (TOWEL DISPOSABLE) ×2 IMPLANT
TRAY LAPAROSCOPIC (CUSTOM PROCEDURE TRAY) ×2 IMPLANT
TROCAR XCEL NON-BLD 11X100MML (ENDOMECHANICALS) ×2 IMPLANT
TROCAR XCEL NON-BLD 5MMX100MML (ENDOMECHANICALS) ×2 IMPLANT

## 2014-05-31 NOTE — Transfer of Care (Signed)
Immediate Anesthesia Transfer of Care Note  Patient: Leslie Keller  Procedure(s) Performed: Procedure(s): LAPAROSCOPIC CHOLECYSTECTOMY (N/A)  Patient Location: PACU  Anesthesia Type:General  Level of Consciousness: awake, alert , confused and responds to stimulation  Airway & Oxygen Therapy: Patient Spontanous Breathing and Patient connected to nasal cannula oxygen  Post-op Assessment: Report given to PACU RN, Post -op Vital signs reviewed and stable and Patient moving all extremities X 4  Post vital signs: Reviewed and stable  Complications: No apparent anesthesia complications

## 2014-05-31 NOTE — Discharge Instructions (Signed)
[facility] ______[facility] °LAPAROSCOPIC SURGERY: POST OP INSTRUCTIONS °Always review your discharge instruction sheet given to you by the facility where your surgery was performed. °IF YOU HAVE DISABILITY OR FAMILY LEAVE FORMS, YOU MUST BRING THEM TO THE OFFICE FOR PROCESSING.   °DO NOT GIVE THEM TO YOUR DOCTOR. ° °1. A prescription for pain medication may be given to you upon discharge.  Take your pain medication as prescribed, if needed.  If narcotic pain medicine is not needed, then you may take acetaminophen (Tylenol) or ibuprofen (Advil) as needed. °2. Take your usually prescribed medications unless otherwise directed. °3. If you need a refill on your pain medication, please contact your pharmacy.  They will contact our office to request authorization. Prescriptions will not be filled after 5pm or on week-ends. °4. You should follow a light diet the first few days after arrival home, such as soup and crackers, etc.  Be sure to include lots of fluids daily. °5. Most patients will experience some swelling and bruising in the area of the incisions.  Ice packs will help.  Swelling and bruising can take several days to resolve.  °6. It is common to experience some constipation if taking pain medication after surgery.  Increasing fluid intake and taking a stool softener (such as Colace) will usually help or prevent this problem from occurring.  A mild laxative (Milk of Magnesia or Miralax) should be taken according to package instructions if there are no bowel movements after 48 hours. °7. Unless discharge instructions indicate otherwise, you may remove your bandages 24-48 hours after surgery, and you may shower at that time.  You may have steri-strips (small skin tapes) in place directly over the incision.  These strips should be left on the skin for 7-10 days.  If your surgeon used skin glue on the incision, you may shower in 24 hours.  The glue will flake off over the next 2-3 weeks.  Any sutures or  staples will be removed at the office during your follow-up visit. °8. ACTIVITIES:  You may resume regular (light) daily activities beginning the next day--such as daily self-care, walking, climbing stairs--gradually increasing activities as tolerated.  You may have sexual intercourse when it is comfortable.  Refrain from any heavy lifting or straining until approved by your doctor. °a. You may drive when you are no longer taking prescription pain medication, you can comfortably wear a seatbelt, and you can safely maneuver your car and apply brakes. °b. RETURN TO WORK:  __________________________________________________________ °9. You should see your doctor in the office for a follow-up appointment approximately 2-3 weeks after your surgery.  Make sure that you call for this appointment within a day or two after you arrive home to insure a convenient appointment time. °10. OTHER INSTRUCTIONS: __________________________________________________________________________________________________________________________ __________________________________________________________________________________________________________________________ °WHEN TO CALL YOUR DOCTOR: °1. Fever over 101.0 °2. Inability to urinate °3. Continued bleeding from incision. °4. Increased pain, redness, or drainage from the incision. °5. Increasing abdominal pain ° °The clinic staff is available to answer your questions during regular business hours.  Please don’t hesitate to call and ask to speak to one of the nurses for clinical concerns.  If you have a medical emergency, go to the nearest emergency room or call 911.  A surgeon from [facility] is always on call at the hospital. °[address], [address], [place], [place]  [address] ? [address] [address], [place], [place]   [address] °[phone] ? [phone] ? FAX [phone] °Web site:   [email or web address] ° °General Anesthesia, Adult, Care After  °Refer to this  sheet in the next few weeks. These instructions provide you with information on caring for yourself after your procedure. Your health care provider may also give you more specific instructions. Your treatment has been planned according to current medical practices, but problems sometimes occur. Call your health care provider if you have any problems or questions after your procedure.  °WHAT TO EXPECT AFTER THE PROCEDURE  °After the procedure, it is typical to experience:  °Sleepiness.  °Nausea and vomiting. °HOME CARE INSTRUCTIONS  °For the first 24 hours after general anesthesia:  °Have a responsible person with you.  °Do not drive a car. If you are alone, do not take public transportation.  °Do not drink alcohol.  °Do not take medicine that has not been prescribed by your health care provider.  °Do not sign important papers or make important decisions.  °You may resume a normal diet and activities as directed by your health care provider.  °Change bandages (dressings) as directed.  °If you have questions or problems that seem related to general anesthesia, call the hospital and ask for the anesthetist or anesthesiologist on call. °SEEK MEDICAL CARE IF:  °You have nausea and vomiting that continue the day after anesthesia.  °You develop a rash. °SEEK IMMEDIATE MEDICAL CARE IF:  °You have difficulty breathing.  °You have chest pain.  °You have any allergic problems. °Document Released: 12/02/2000 Document Revised: 04/28/2013 Document Reviewed: 03/11/2013  °ExitCare® Patient Information ©2014 [employer].  ° ° °

## 2014-05-31 NOTE — Anesthesia Preprocedure Evaluation (Signed)
Anesthesia Evaluation  Patient identified by MRN, date of birth, ID band Patient awake    Reviewed: Allergy & Precautions, H&P , NPO status , Patient's Chart, lab work & pertinent test results  Airway Mallampati: II  TM Distance: >3 FB Neck ROM: Full    Dental no notable dental hx.    Pulmonary neg pulmonary ROS, former smoker,  breath sounds clear to auscultation  Pulmonary exam normal       Cardiovascular negative cardio ROS  Rhythm:Regular Rate:Normal     Neuro/Psych negative neurological ROS  negative psych ROS   GI/Hepatic Neg liver ROS, GERD-  Medicated,  Endo/Other  negative endocrine ROS  Renal/GU negative Renal ROS  negative genitourinary   Musculoskeletal negative musculoskeletal ROS (+)   Abdominal   Peds negative pediatric ROS (+)  Hematology negative hematology ROS (+)   Anesthesia Other Findings   Reproductive/Obstetrics negative OB ROS                             Anesthesia Physical Anesthesia Plan  ASA: II  Anesthesia Plan: General   Post-op Pain Management:    Induction: Intravenous  Airway Management Planned: Oral ETT  Additional Equipment:   Intra-op Plan:   Post-operative Plan: Extubation in OR  Informed Consent: I have reviewed the patients History and Physical, chart, labs and discussed the procedure including the risks, benefits and alternatives for the proposed anesthesia with the patient or authorized representative who has indicated his/her understanding and acceptance.   Dental advisory given  Plan Discussed with: CRNA and Surgeon  Anesthesia Plan Comments:         Anesthesia Quick Evaluation  

## 2014-05-31 NOTE — Op Note (Signed)
05/31/2014  10:25 AM  PATIENT:  Leslie Keller  38 y.o. female  PRE-OPERATIVE DIAGNOSIS:  Symptomatic gallstones  POST-OPERATIVE DIAGNOSIS:  Symptomatic gallstones  PROCEDURE:  Procedure(s): LAPAROSCOPIC CHOLECYSTECTOMY (N/A)  SURGEON:  Surgeon(s) and Role:    * Axel Filler, MD - Primary  ASSISTANTS: none   ANESTHESIA:   local and general  EBL:     BLOOD ADMINISTERED:none  DRAINS: none   LOCAL MEDICATIONS USED:  BUPIVICAINE   SPECIMEN:  Source of Specimen:  gallbladder  DISPOSITION OF SPECIMEN:  PATHOLOGY  COUNTS:  YES  TOURNIQUET:  * No tourniquets in log *  DICTATION: .Dragon Dictation Findings: large gallstone  Details of the procedure: The patient was taken to the operating and placed in the supine position with bilateral SCDs in place. A time out was called and all facts were verified. A pneumoperitoneum was obtained via A Veress needle technique to a pressure of 14mm of mercury. A 5mm trochar was then placed in the right upper quadrant under visualization, and there were no injuries to any abdominal organs. A 11 mm port was then placed in the umbilical region after infiltrating with local anesthesia under direct visualization. A second epigastric port was placed under direct visualization. The gallbladder was identified and retracted, the peritoneum was then sharply dissected from the gallbladder and this dissection was carried down to Calot's triangle. The cystic duct was identified and stripped away circumferentially and seen going into the gallbladder 360, the critical angle was obtained.  2 clips were placed proximally one distally and the cystic duct transected. The cystic artery was identified and 2 clips placed proximally and one distally and transected. We then proceeded to remove the gallbladder off the hepatic fossa with Bovie cautery. A retrieval bag was then placed in the abdomen and gallbladder placed in the bag, and the bag and specimen were removed  from the  abdomen. The hepatic fossa was then reexamined and hemostasis was achieved with Bovie cautery and was excellent at this portion of the case. The subhepatic fossa and perihepatic fossa was then irrigated until the effluent was clear. The 11 mm trocar fascia was reapproximated with the Endo Close #1 Vicryl x2. The pneumoperitoneum was evacuated and all trochars removed under direct visulalization. The skin was then closed with 4-0 Monocryl and the skin dressed with Steri-Strips, gauze, and tape. The patient was awaken from general anesthesia and taken to the recovery room in stable condition.    PLAN OF CARE: Discharge to home after PACU  PATIENT DISPOSITION:  PACU - hemodynamically stable.   Delay start of Pharmacological VTE agent (>24hrs) due to surgical blood loss or risk of bleeding: not applicable

## 2014-05-31 NOTE — Anesthesia Postprocedure Evaluation (Signed)
  Anesthesia Post-op Note  Patient: Leslie Keller  Procedure(s) Performed: Procedure(s) (LRB): LAPAROSCOPIC CHOLECYSTECTOMY (N/A)  Patient Location: PACU  Anesthesia Type: General  Level of Consciousness: awake and alert   Airway and Oxygen Therapy: Patient Spontanous Breathing  Post-op Pain: mild  Post-op Assessment: Post-op Vital signs reviewed, Patient's Cardiovascular Status Stable, Respiratory Function Stable, Patent Airway and No signs of Nausea or vomiting  Last Vitals:  Filed Vitals:   05/31/14 1100  BP: 166/87  Pulse: 72  Temp:   Resp: 22    Post-op Vital Signs: stable   Complications: No apparent anesthesia complications

## 2014-05-31 NOTE — H&P (Signed)
HPI  Leslie Keller is a 38 y.o. female. The patient is a 38 year old female who is referred by Dr. Evette Cristal for an evaluation of symptomatic cholelithiasis and gallbladder polyp. The patient states she's been having abdomihe'snal pain for the last 9 years. Ultrasound revealed a 1.3 cm gallbladder polyp.  The patient states she has been center low-fat diet. She states that the pain began to whether or not she is having a low-fat diet or not.  The patient is a shows Gilbert's syndrome.  HPI  Past Medical History   Diagnosis  Date   .  Former smoker      QUIT 10-Jan-2003   .  Seasonal allergies    .  Bilateral ovarian cysts     Past Surgical History   Procedure  Laterality  Date   .  Augmentation mammaplasty   [id number]    Family History   Problem  Relation  Age of Onset   .  Heart failure  Father      DIED 2009/01/09   .  Other  Maternal Grandmother      BLOOD DISORDER   .  Other  Paternal Grandmother      ALZHEIMERS   Social History  History   Substance Use Topics   .  Smoking status:  Former Games developer   .  Smokeless tobacco:  Never Used   .  Alcohol Use:  No      Comment: SOCIALLY ONLY   No Known Allergies  Current Outpatient Prescriptions   Medication  Sig  Dispense  Refill   .  CALCIUM PO  Take by mouth.     .  dicyclomine (BENTYL) 20 MG tablet      .  Ibuprofen 200 MG CAPS  Take by mouth.     .  metroNIDAZOLE (METROGEL VAGINAL) 0.75 % vaginal gel  1 applicator per vagina at HS x 5  70 g  0   .  Multiple Vitamin (MULTIVITAMIN) capsule  Take 1 capsule by mouth daily.     .  valACYclovir (VALTREX) 1000 MG tablet      .  cephALEXin (KEFLEX) 500 MG capsule  Take 1 capsule (500 mg total) by mouth 4 (four) times daily.  40 capsule  0   .  metroNIDAZOLE (FLAGYL) 500 MG tablet  Take 1 tablet (500 mg total) by mouth 2 (two) times daily. One po bid x 7 days  14 tablet  0    No current facility-administered medications for this visit.   Review of Systems  Review of Systems  Constitutional:  Negative.  HENT: Negative.  Respiratory: Negative.  Cardiovascular: Negative.  Gastrointestinal: Positive for abdominal pain.  Neurological: Negative.  All other systems reviewed and are negative.  BP 150/71  Pulse 53  Temp(Src) 97.7 F (36.5 C) (Oral)  Resp 20  Ht  (1.651 m)  Wt 132 lb (59.875 kg)  BMI 21.97 kg/m2  SpO2 100%  LMP 05/28/2014 Physical Exam  Physical Exam  Constitutional: She is oriented to person, place, and time. She appears well-developed and well-nourished.  HENT:  Head: Normocephalic and atraumatic.  Eyes: Conjunctivae and EOM are normal. Pupils are equal, round, and reactive to light.  Neck: Normal range of motion. Neck supple.  Cardiovascular: Normal rate, regular rhythm and normal heart sounds.  Pulmonary/Chest: Effort normal and breath sounds normal.  Abdominal: Soft. Bowel sounds are normal. She exhibits no distension. There is tenderness (RUQ). There is no rebound and no guarding.  Musculoskeletal: Normal  range of motion.  Neurological: She is alert and oriented to person, place, and time.  Skin: Skin is warm and dry.  Psychiatric: She has a normal mood and affect.  Data Reviewed  Ultrasound as above  Assessment  38 year old female sent by cholelithiasis and gallbladder polyp.  Plan  1. We will proceed to the operating room for laparoscopic cholecystectomy.  2.All risks and benefits were discussed with the patient to generally include: infection, bleeding, possible need for post op ERCP, damage to the bile ducts, and bile leak. Alternatives were offered and described. All questions were answered and the patient voiced understanding of the procedure and wishes to proceed at this point with a laparoscopic cholecystectomy

## 2014-06-03 ENCOUNTER — Encounter (HOSPITAL_COMMUNITY): Payer: Self-pay | Admitting: General Surgery

## 2014-06-13 ENCOUNTER — Other Ambulatory Visit (INDEPENDENT_AMBULATORY_CARE_PROVIDER_SITE_OTHER): Payer: Self-pay

## 2014-06-13 DIAGNOSIS — Z9049 Acquired absence of other specified parts of digestive tract: Secondary | ICD-10-CM

## 2014-06-13 LAB — CBC
HCT: 40.6 % (ref 36.0–46.0)
Hemoglobin: 14.1 g/dL (ref 12.0–15.0)
MCH: 30.4 pg (ref 26.0–34.0)
MCHC: 34.7 g/dL (ref 30.0–36.0)
MCV: 87.5 fL (ref 78.0–100.0)
PLATELETS: 216 10*3/uL (ref 150–400)
RBC: 4.64 MIL/uL (ref 3.87–5.11)
RDW: 13.1 % (ref 11.5–15.5)
WBC: 7.6 10*3/uL (ref 4.0–10.5)

## 2014-06-13 LAB — COMPREHENSIVE METABOLIC PANEL
ALBUMIN: 4.4 g/dL (ref 3.5–5.2)
ALK PHOS: 53 U/L (ref 39–117)
ALT: 23 U/L (ref 0–35)
AST: 14 U/L (ref 0–37)
BUN: 16 mg/dL (ref 6–23)
CO2: 27 mEq/L (ref 19–32)
Calcium: 9.5 mg/dL (ref 8.4–10.5)
Chloride: 101 mEq/L (ref 96–112)
Creat: 0.94 mg/dL (ref 0.50–1.10)
GLUCOSE: 84 mg/dL (ref 70–99)
POTASSIUM: 3.9 meq/L (ref 3.5–5.3)
SODIUM: 138 meq/L (ref 135–145)
TOTAL PROTEIN: 7.5 g/dL (ref 6.0–8.3)
Total Bilirubin: 0.8 mg/dL (ref 0.2–1.2)

## 2014-07-11 ENCOUNTER — Encounter (HOSPITAL_COMMUNITY): Payer: Self-pay | Admitting: General Surgery

## 2015-03-08 ENCOUNTER — Encounter: Payer: Self-pay | Admitting: Women's Health

## 2015-03-08 ENCOUNTER — Ambulatory Visit (INDEPENDENT_AMBULATORY_CARE_PROVIDER_SITE_OTHER): Payer: BLUE CROSS/BLUE SHIELD | Admitting: Women's Health

## 2015-03-08 ENCOUNTER — Other Ambulatory Visit (HOSPITAL_COMMUNITY)
Admission: RE | Admit: 2015-03-08 | Discharge: 2015-03-08 | Disposition: A | Discharge status: Home / Self Care | Payer: BLUE CROSS/BLUE SHIELD | Source: Ambulatory Visit | Attending: Women's Health | Admitting: Women's Health

## 2015-03-08 VITALS — BP 138/80 | Ht 65.0 in | Wt 135.0 lb

## 2015-03-08 DIAGNOSIS — N926 Irregular menstruation, unspecified: Secondary | ICD-10-CM | POA: Diagnosis not present

## 2015-03-08 DIAGNOSIS — Z1151 Encounter for screening for human papillomavirus (HPV): Secondary | ICD-10-CM | POA: Insufficient documentation

## 2015-03-08 DIAGNOSIS — Z01419 Encounter for gynecological examination (general) (routine) without abnormal findings: Secondary | ICD-10-CM | POA: Insufficient documentation

## 2015-03-08 DIAGNOSIS — Z113 Encounter for screening for infections with a predominantly sexual mode of transmission: Secondary | ICD-10-CM

## 2015-03-08 DIAGNOSIS — K529 Noninfective gastroenteritis and colitis, unspecified: Secondary | ICD-10-CM | POA: Diagnosis not present

## 2015-03-08 DIAGNOSIS — N898 Other specified noninflammatory disorders of vagina: Secondary | ICD-10-CM

## 2015-03-08 LAB — WET PREP FOR TRICH, YEAST, CLUE
CLUE CELLS WET PREP: NONE SEEN
TRICH WET PREP: NONE SEEN
WBC, Wet Prep HPF POC: NONE SEEN
YEAST WET PREP: NONE SEEN

## 2015-03-08 LAB — TSH: TSH: 0.874 u[IU]/mL (ref 0.350–4.500)

## 2015-03-08 MED ORDER — ZALEPLON 5 MG PO CAPS: 5.0000 mg | ORAL_CAPSULE | Freq: Every evening | ORAL | Status: AC | PRN

## 2015-03-08 NOTE — Addendum Note (Signed)
Addended by: Kem ParkinsonBARNES, Lonnell Chaput on: 03/08/2015 02:24 PM   Modules accepted: Orders

## 2015-03-08 NOTE — Addendum Note (Signed)
Addended by: Kem ParkinsonBARNES, Jaideep Pollack on: 03/08/2015 02:50 PM   Modules accepted: Orders

## 2015-03-08 NOTE — Progress Notes (Signed)
Leslie Keller 02/19/1976 [id number]    History:    Presents for annual exam. Complains of irregular periods for the past several months, one day of cramping with minor spotting. New partner/no condoms/vasectomy. Normal PAP history. History of colitis/Dr. Joanell Keller manages labs and meds/Cholecysectomy surgery in Sept 2015.   Past medical history, past surgical history, family history and social history were all reviewed and documented in the EPIC chart. [employer]. Works at [employer].   ROS:  A ROS was performed and pertinent positives and negatives are included.  Exam:  Filed Vitals:   03/08/15 1153  BP: 138/80    General appearance:  Normal Thyroid:  Symmetrical, normal in size, without palpable masses or nodularity. Respiratory  Auscultation:  Clear without wheezing or rhonchi Cardiovascular  Auscultation:  Regular rate, without rubs, murmurs or gallops  Edema/varicosities:  Not grossly evident Abdominal  Soft,nontender, without masses, guarding or rebound.  Liver/spleen:  No organomegaly noted  Hernia:  None appreciated  Skin  Inspection:  Grossly normal   Breasts: Examined lying and sitting.  Augmented saline    Right: Without masses, retractions, discharge or axillary adenopathy.     Left: Without masses, retractions, discharge or axillary adenopathy. Gentitourinary   Inguinal/mons:  Normal without inguinal adenopathy  External genitalia:  Normal  BUS/Urethra/Skene's glands:  Normal  Vagina:  Normal. Wet prep negative.   Cervix:  Normal  Uterus:  Anteverted, normal in size, shape and contour.  Midline and mobile  Adnexa/parametria:     Rt: Without masses or tenderness.   Lt: Without masses or tenderness.  Anus and perineum: Normal  Digital rectal exam: Normal sphincter tone without palpated masses or tenderness  Assessment/Plan:  39 y.o. SWF G0 for annual exam. Complains of irregular periods and insomnia.  Irregular periods/vasectomy Insomnia   STD Screening Colitis-Dr. Joanell Keller manages  Plan: Sonata 5mg  PRN HS. Medication reviewed and proper sleep hygiene encouraged. PAP with HR HPV typing, new screening guidelines reviewed, GC/Chlamydia, HIV, Hep B, Hep C,  RPR, TSH, Prolactin, FSH. Continue healthy diet/exercise/MVI.  SBE's, annual screening mammogram 3-D tomography breast center information given and reviewed importance of annual screen.    Harrington ChallengerYOUNG,Leslie Keller J [facility] LPWHNP, 12:57 PM 03/08/2015

## 2015-03-08 NOTE — Patient Instructions (Signed)

## 2015-03-09 LAB — HEPATITIS B SURFACE ANTIGEN: Hepatitis B Surface Ag: NEGATIVE

## 2015-03-09 LAB — PROLACTIN: Prolactin: 8.7 ng/mL

## 2015-03-09 LAB — HEPATITIS C ANTIBODY: HCV AB: NEGATIVE

## 2015-03-09 LAB — HIV ANTIBODY (ROUTINE TESTING W REFLEX): HIV: NONREACTIVE

## 2015-03-09 LAB — FOLLICLE STIMULATING HORMONE: FSH: 4.2 m[IU]/mL

## 2015-03-09 LAB — GC/CHLAMYDIA PROBE AMP
CT Probe RNA: NEGATIVE
GC Probe RNA: NEGATIVE

## 2015-03-09 LAB — RPR

## 2015-03-10 LAB — CYTOLOGY - PAP

## 2015-03-30 ENCOUNTER — Ambulatory Visit
Admission: RE | Admit: 2015-03-30 | Discharge: 2015-03-30 | Disposition: A | Discharge status: Home / Self Care | Payer: BLUE CROSS/BLUE SHIELD | Source: Ambulatory Visit | Attending: Chiropractor | Admitting: Chiropractor

## 2015-03-30 ENCOUNTER — Other Ambulatory Visit: Payer: Self-pay | Admitting: Chiropractor

## 2015-03-30 DIAGNOSIS — M25512 Pain in left shoulder: Principal | ICD-10-CM

## 2015-03-30 DIAGNOSIS — M25511 Pain in right shoulder: Secondary | ICD-10-CM

## 2015-05-22 ENCOUNTER — Other Ambulatory Visit: Payer: Self-pay | Admitting: Chiropractor

## 2016-06-27 IMAGING — US US ABDOMEN COMPLETE
1 series · 13 of 25 positions shown · non-contrast
Comparison: None.

CLINICAL DATA: Chronic abdominal pain

EXAM:
ULTRASOUND ABDOMEN COMPLETE

[Series 1: us abdomen complete · 0.23mm/px · 13 of 89 slices shown]
[im 1/89]
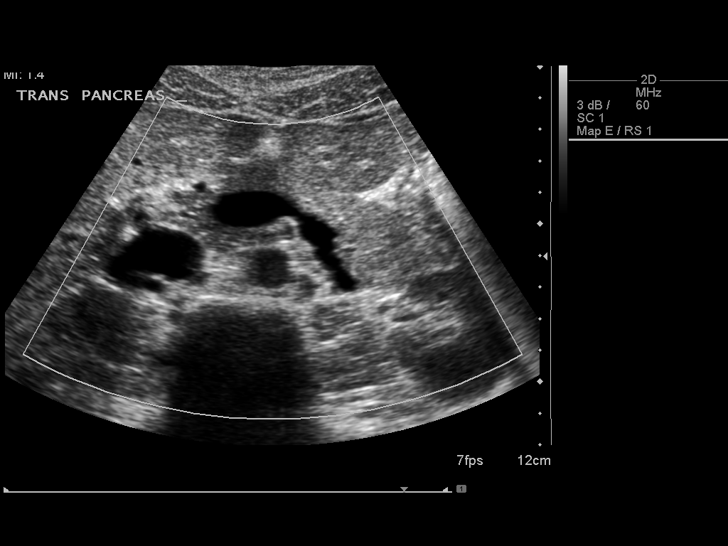
[im 8/89]
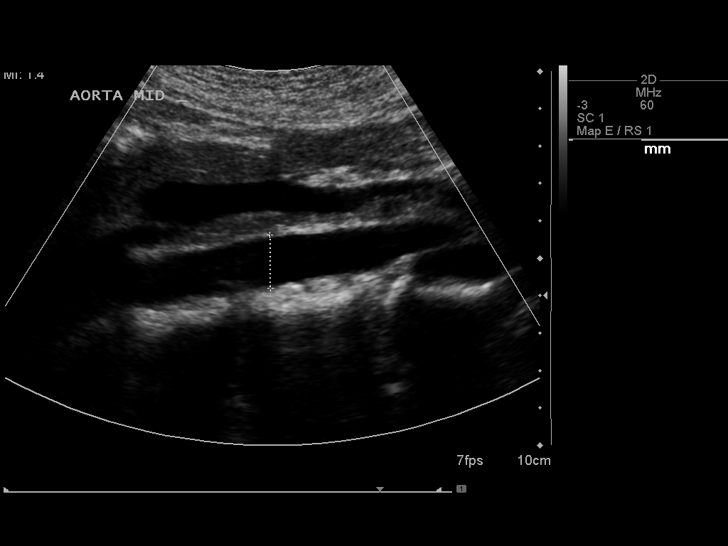
[im 15/89]
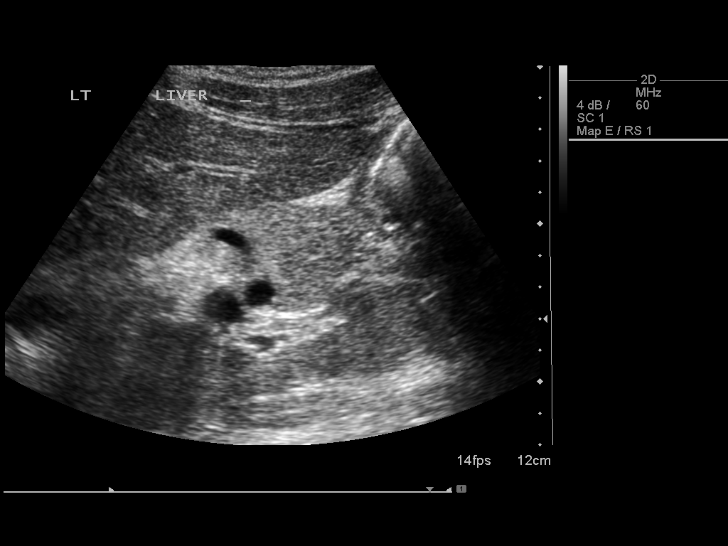
[im 23/89]
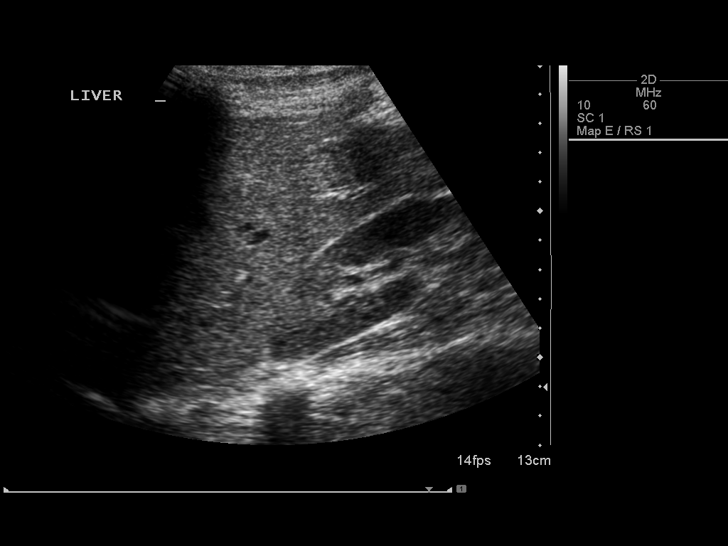
[im 30/89]
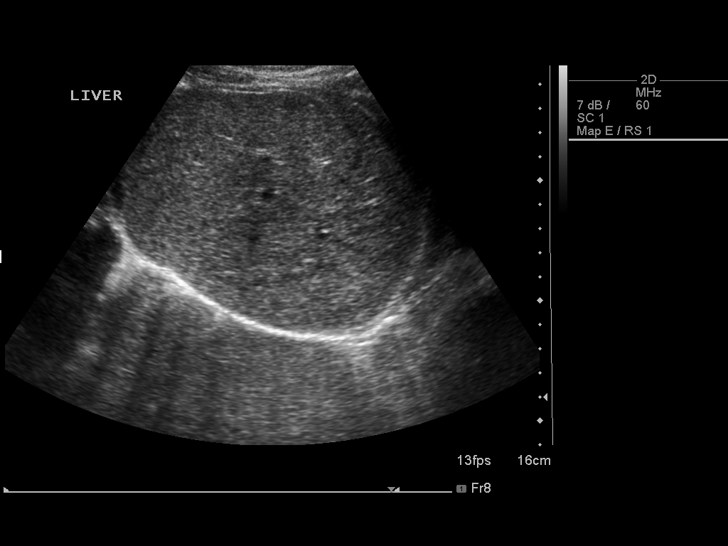
[im 37/89]
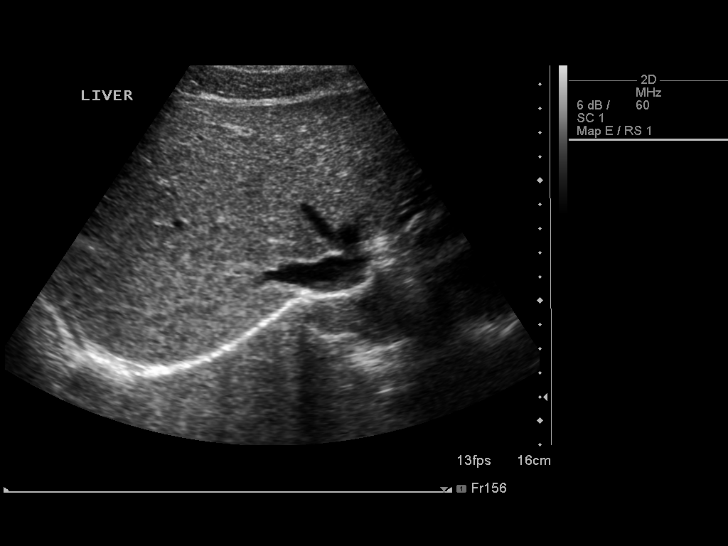
[im 45/89]
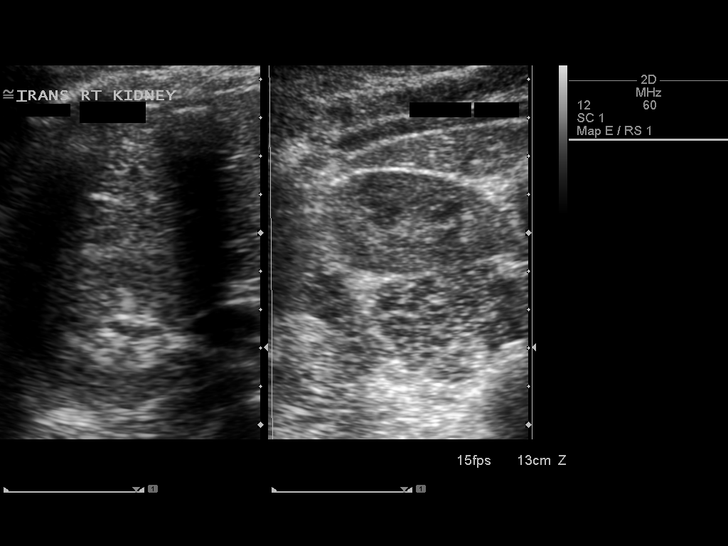
[im 52/89]
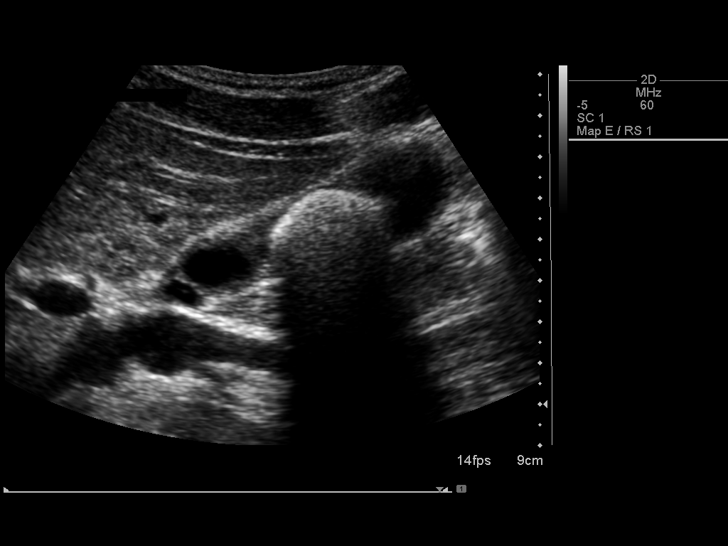
[im 59/89]
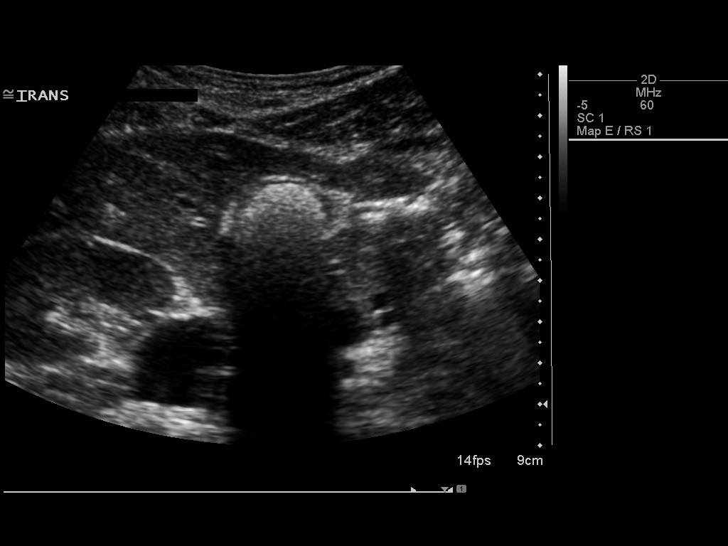
[im 67/89]
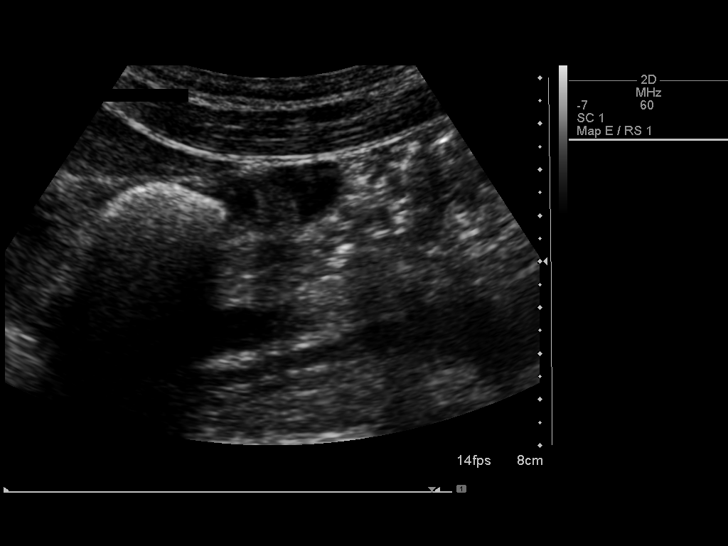
[im 74/89]
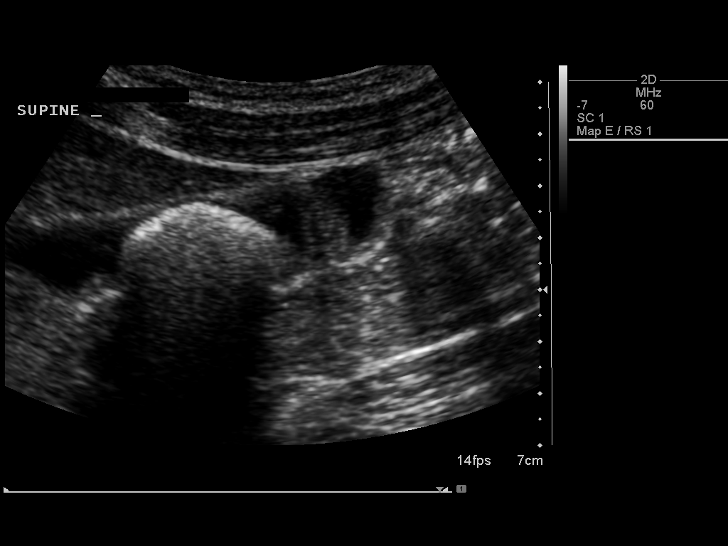
[im 81/89]
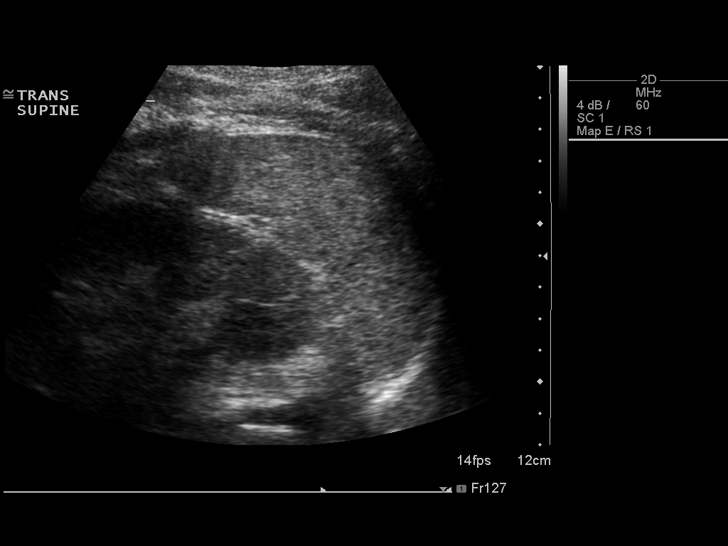
[im 89/89]
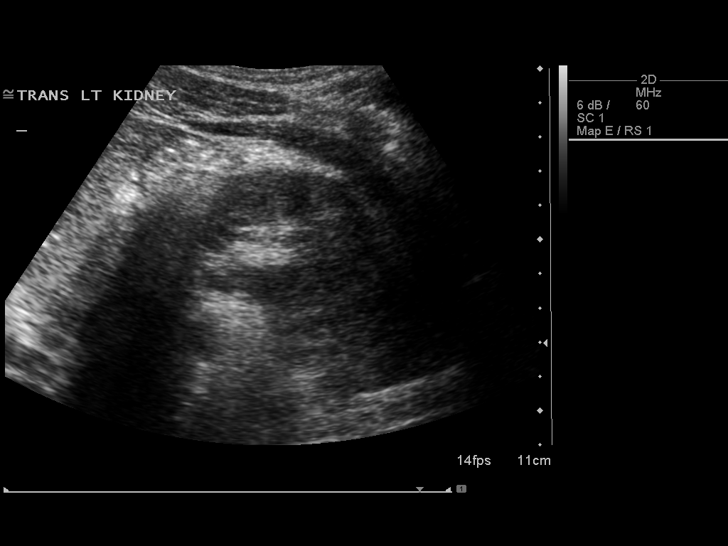

[13 of 25 positions shown; findings below may reference images not displayed]

FINDINGS: Gallbladder:

The gallbladder is adequately distended and contains a shadowing
cm stone. There is an additional structure within the gallbladder
which does not shadow which measures 1.3 cm in greatest dimension.
There is no positive sonographic Murphy's sign. The gallbladder wall
is not abnormally thickened.

Common bile duct:

Diameter: 1.8 mm

Liver:

No focal lesion identified. Within normal limits in parenchymal
echogenicity.

IVC:

No abnormality visualized.

Pancreas:

Visualized portion unremarkable.

Spleen:

Size and appearance within normal limits.

Right Kidney:

Length: 11.6 cm. Echogenicity within normal limits. No mass or
hydronephrosis visualized.

Left Kidney:

Length: 12.2 cm. Echogenicity within normal limits. No mass or
hydronephrosis visualized.

Abdominal aorta:

No aneurysm visualized.

Other findings:

None.
IMPRESSION: There is a large calcified gallstone. In addition there is soft
tissue echotexture non shadowing material which is nonspecific but
could reflect sludge, a non calcified stone column, or polyp. There
is no sonographic evidence of acute cholecystitis.

## 2017-01-13 ENCOUNTER — Encounter (HOSPITAL_COMMUNITY): Payer: Self-pay | Admitting: Vascular Surgery

## 2017-01-13 ENCOUNTER — Telehealth: Payer: Self-pay | Admitting: *Deleted

## 2017-01-13 DIAGNOSIS — N390 Urinary tract infection, site not specified: Secondary | ICD-10-CM | POA: Insufficient documentation

## 2017-01-13 DIAGNOSIS — Z87891 Personal history of nicotine dependence: Secondary | ICD-10-CM | POA: Insufficient documentation

## 2017-01-13 DIAGNOSIS — R109 Unspecified abdominal pain: Secondary | ICD-10-CM | POA: Insufficient documentation

## 2017-01-13 DIAGNOSIS — Z79899 Other long term (current) drug therapy: Secondary | ICD-10-CM | POA: Insufficient documentation

## 2017-01-13 LAB — URINALYSIS, ROUTINE W REFLEX MICROSCOPIC
Bilirubin Urine: NEGATIVE
GLUCOSE, UA: NEGATIVE mg/dL
Ketones, ur: NEGATIVE mg/dL
NITRITE: NEGATIVE
PH: 7 (ref 5.0–8.0)
Protein, ur: 100 mg/dL — AB
SPECIFIC GRAVITY, URINE: 1.015 (ref 1.005–1.030)

## 2017-01-13 LAB — POC URINE PREG, ED: Preg Test, Ur: NEGATIVE

## 2017-01-13 NOTE — Telephone Encounter (Signed)
Pt called and left message in triage voicemail requesting to speak with Maryelizabeth RowanNancy Young, NP, I called pt back and asked her to call me.

## 2017-01-13 NOTE — Telephone Encounter (Signed)
Pt called out with insurance now, no job asked if you were going to be at pap smear clinic 02/18/16? I told pt I was not sure her appointment is after office hours, I did tell pt that she could forward results to you. mammogram scheduled on 02/13/17. Please advise

## 2017-01-13 NOTE — ED Triage Notes (Signed)
Pt reports to the ED for eval of urinary frequency and bladder pressure. She states that she had these symptoms earlier this month and her symptoms went away but this past week they came back. She has tried AZO for her symptom but has not noticed a decrease in her symptoms. She also reports some low back pain as well. She has some N/V that started yesterday.

## 2017-01-14 ENCOUNTER — Emergency Department (HOSPITAL_COMMUNITY)
Admission: EM | Admit: 2017-01-14 | Discharge: 2017-01-14 | Disposition: A | Discharge status: Home / Self Care | Payer: BLUE CROSS/BLUE SHIELD | Attending: Emergency Medicine | Admitting: Emergency Medicine

## 2017-01-14 ENCOUNTER — Emergency Department (HOSPITAL_COMMUNITY): Payer: BLUE CROSS/BLUE SHIELD

## 2017-01-14 DIAGNOSIS — N390 Urinary tract infection, site not specified: Secondary | ICD-10-CM

## 2017-01-14 DIAGNOSIS — R300 Dysuria: Secondary | ICD-10-CM

## 2017-01-14 MED ORDER — HYDROCODONE-ACETAMINOPHEN 5-325 MG PO TABS
1.0000 | ORAL_TABLET | Freq: Once | ORAL | Status: AC
  Administered 2017-01-14: 1 via ORAL
  Filled 2017-01-14: qty 1

## 2017-01-14 MED ORDER — HYDROCODONE-ACETAMINOPHEN 5-325 MG PO TABS: 2.0000 | ORAL_TABLET | ORAL | 0 refills | Status: AC | PRN

## 2017-01-14 MED ORDER — SULFAMETHOXAZOLE-TRIMETHOPRIM 800-160 MG PO TABS
1.0000 | ORAL_TABLET | Freq: Once | ORAL | Status: AC
  Administered 2017-01-14: 1 via ORAL
  Filled 2017-01-14: qty 1

## 2017-01-14 MED ORDER — PHENAZOPYRIDINE HCL 200 MG PO TABS: 200.0000 mg | ORAL_TABLET | Freq: Three times a day (TID) | ORAL | 0 refills | Status: AC

## 2017-01-14 MED ORDER — PHENAZOPYRIDINE HCL 100 MG PO TABS
95.0000 mg | ORAL_TABLET | Freq: Once | ORAL | Status: AC
  Administered 2017-01-14: 100 mg via ORAL
  Filled 2017-01-14: qty 1

## 2017-01-14 MED ORDER — SULFAMETHOXAZOLE-TRIMETHOPRIM 800-160 MG PO TABS: 1.0000 | ORAL_TABLET | Freq: Two times a day (BID) | ORAL | 0 refills | Status: AC

## 2017-01-14 NOTE — ED Notes (Signed)
Patient verbalized understanding of discharge instructions and denies any further needs or questions at this time. VS stable. Patient ambulatory with steady gait.  

## 2017-01-14 NOTE — Discharge Instructions (Signed)
You're currently being treated for urinary tract infection.  Your CT scan showed that you have several small stones within the left kidney itself. As discussed.  Her urine has been cultured if a change in antibiotic is required, you will be notified.

## 2017-01-14 NOTE — Telephone Encounter (Signed)
Pt informed with the below. 

## 2017-01-14 NOTE — ED Provider Notes (Signed)
[facility]-EMERGENCY DEPT Provider Note   CSN: [id number] Arrival date & time: 01/13/17  2009     History   Chief Complaint Chief Complaint  Patient presents with  . Urinary Tract Infection    HPI Leslie Keller is a 41 y.o. female.  This a 41 year old female with a history of kidney stones 1 that she spontaneously passed without difficulty.  She's also had several urinary tract infections.  She noted last week that she was having some dysuria and discomfort.  She started taking Azo.  She thought she had a yeast infection, so she used over-the-counter Monistat with resolution of her vaginal discharge.  She is presenting now with worsening dysuria and bladder pressure and discomfort in her lower back.  She also reports that she's had some nausea and headache.      Past Medical History:  Diagnosis Date  . Bilateral ovarian cysts   . Family history of anesthesia complication    father had to be resuscitated on the OR table, states he had swelling & (before surgery- incision), family understood that it was related to antibiotic, but not sure. Event took place in [place],  . Former smoker    QUIT 2004  . GERD (gastroesophageal reflux disease)   . Headache(784.0)    migraines  . History of kidney stones   . Hypoglycemia   . PONV (postoperative nausea and vomiting)   . Seasonal allergies     Patient Active Problem List   Diagnosis Date Noted  . Noninfectious gastroenteritis and colitis 03/08/2015  . Former smoker     Past Surgical History:  Procedure Laterality Date  . AUGMENTATION MAMMAPLASTY  [id number]  . CHOLECYSTECTOMY N/A 05/31/2014   Procedure: LAPAROSCOPIC CHOLECYSTECTOMY;  Surgeon: Axel Filler, MD;  Location: [facility] OR;  Service: General;  Laterality: N/A;    OB History    Gravida Para Term Preterm AB Living   1       1     SAB TAB Ectopic Multiple Live Births     1             Home Medications    Prior to Admission medications   Medication Sig Start Date End  Date Taking? Authorizing Provider  budesonide (ENTOCORT EC) 3 MG 24 hr capsule 3 tabs po qd 02/16/15   [provider]  cholestyramine Lanetta Inch) 4 G packet Take 4 g by mouth as directed.    [provider]  doxycycline (DORYX) 150 MG EC tablet Take 1 tablet by mouth daily. 02/27/15   [provider]  fexofenadine (ALLEGRA) 180 MG tablet Take 180 mg by mouth daily as needed for allergies or rhinitis.    [provider]  HYDROcodone-acetaminophen (NORCO/VICODIN) 5-325 MG tablet Take 2 tablets by mouth every 4 (four) hours as needed. 01/14/17   Earley Favor, NP  Multiple Vitamin (MULTIVITAMIN) capsule Take 1 capsule by mouth daily.    [provider]  omeprazole (PRILOSEC) 20 MG capsule Take 20 mg by mouth daily.    [provider]  phenazopyridine (PYRIDIUM) 200 MG tablet Take 1 tablet (200 mg total) by mouth 3 (three) times daily. 01/14/17   Earley Favor, NP  Probiotic Product (PROBIOTIC PO) Take 2 tablets by mouth daily.    [provider]  sulfamethoxazole-trimethoprim (BACTRIM DS,SEPTRA DS) 800-160 MG tablet Take 1 tablet by mouth 2 (two) times daily. 01/14/17 01/21/17  Earley Favor, NP  valACYclovir (VALTREX) 1000 MG tablet Take 2,000 mg by mouth daily as needed (for cold  sores/fever blisters).  03/03/14   [provider]  vitamin C (ASCORBIC ACID) 500 MG tablet Take 1,000 mg by mouth daily.    [provider]  zaleplon (SONATA) 5 MG capsule Take 1 capsule (5 mg total) by mouth at bedtime as needed for sleep. 03/08/15   Harrington ChallengerYoung, Nancy J, NP    Family History Family History  Problem Relation Age of Onset  . Heart failure Father     DIED 2010  . Other Maternal Grandmother     BLOOD DISORDER  . Other Paternal Grandmother     ALZHEIMERS    Social History Social History  Substance Use Topics  . Smoking status: Former Games developermoker  . Smokeless tobacco: Never Used  . Alcohol use 0.0 oz/week     Comment: SOCIALLY ONLY      Allergies   Patient has no known allergies.   Review of Systems Review of Systems  Constitutional: Negative for fever.  Gastrointestinal: Negative for abdominal pain.  Genitourinary: Positive for frequency and urgency. Negative for decreased urine volume, dysuria, vaginal discharge and vaginal pain.  Musculoskeletal: Positive for back pain.  Neurological: Positive for headaches.  All other systems reviewed and are negative.    Physical Exam Updated Vital Signs BP 120/75 (BP Location: Left Arm)   Pulse 60   Temp 98.2 F (36.8 C) (Oral)   Resp 18   SpO2 99%   Physical Exam  Constitutional: She is oriented to person, place, and time. She appears well-developed and well-nourished.  HENT:  Head: Normocephalic.  Eyes: Pupils are equal, round, and reactive to light.  Neck: Normal range of motion.  Cardiovascular: Normal rate.   Pulmonary/Chest: Effort normal.  Abdominal: Soft. She exhibits no distension.  Musculoskeletal: Normal range of motion.  Neurological: She is alert and oriented to person, place, and time.  Skin: Skin is warm and dry.  Psychiatric: She has a normal mood and affect.  Nursing note and vitals reviewed.    ED Treatments / Results  Labs (all labs ordered are listed, but only abnormal results are displayed) Labs Reviewed  URINALYSIS, ROUTINE W REFLEX MICROSCOPIC - Abnormal; Notable for the following:       Result Value   APPearance CLOUDY (*)    Hgb urine dipstick MODERATE (*)    Protein, ur 100 (*)    Leukocytes, UA LARGE (*)    Bacteria, UA RARE (*)    Squamous Epithelial / LPF 0-5 (*)    All other components within normal limits  URINE CULTURE  POC URINE PREG, ED    EKG  EKG Interpretation None       Radiology Ct Renal Stone Study  Result Date: 01/14/2017 CLINICAL DATA:  Right flank pain. Nausea, vomiting, and microhematuria for 3 days. History of kidney stones. EXAM: CT ABDOMEN AND PELVIS WITHOUT CONTRAST TECHNIQUE:  Multidetector CT imaging of the abdomen and pelvis was performed following the standard protocol without IV contrast. COMPARISON:  None. FINDINGS: Lower chest: Lung bases are clear.  Bilateral breast implants. Hepatobiliary: No focal liver abnormality is seen. No gallstones, gallbladder wall thickening, or biliary dilatation. Pancreas: Unremarkable. No pancreatic ductal dilatation or surrounding inflammatory changes. Spleen: Normal in size without focal abnormality. Adrenals/Urinary Tract: No adrenal gland nodules. Stones in the mid and lower pole of the left kidney, largest measuring about 3 mm. No hydronephrosis or hydroureter. No ureteral stones. Bladder wall is not thickened and no bladder stones are identified. Stomach/Bowel: Stomach, small bowel, and colon are not abnormally distended.  No wall thickening is appreciated. Appendix is normal. Vascular/Lymphatic: No significant vascular findings are present. No enlarged abdominal or pelvic lymph nodes. Reproductive: Uterus and bilateral adnexa are unremarkable. Other: No abdominal wall hernia or abnormality. No abdominopelvic ascites. Musculoskeletal: No acute or significant osseous findings. IMPRESSION: Nonobstructing stones in the left kidney. No ureteral stones or obstruction demonstrated. No acute process demonstrated in the abdomen or pelvis on noncontrast imaging. Electronically Signed   By: Burman Nieves M.D.   On: 01/14/2017 01:25    Procedures Procedures (including critical care time)  Medications Ordered in ED Medications  sulfamethoxazole-trimethoprim (BACTRIM DS,SEPTRA DS) 800-160 MG per tablet 1 tablet (1 tablet Oral Given 01/14/17 0125)  phenazopyridine (PYRIDIUM) tablet 100 mg (100 mg Oral Given 01/14/17 0131)  HYDROcodone-acetaminophen (NORCO/VICODIN) 5-325 MG per tablet 1 tablet (1 tablet Oral Given 01/14/17 0145)     Initial Impression / Assessment and Plan / ED Course  I have reviewed the triage vital signs and the nursing  notes.  Pertinent labs & imaging results that were available during my care of the patient were reviewed by me and considered in my medical decision making (see chart for details).    Patient's CT scan shows that she has several punctate stones in the left kidney, nothing within the ureter or urethra or bladder.  She'll be treated for urinary tract infection with Septra and Pyridium,5 hydrocodone for pain, then she can switch to Tylenol or ibuprofen should be given referral to urology as this is her second or third urinary tract infection as well as stones within the kidney.  She does have a history of of ulcerative colitis since childhood which she is managed with Remicade injections and religious follow-up with gastroenterology.  Does not report any blood in her stool for the last 3 or 4 months   Final Clinical Impressions(s) / ED Diagnoses   Final diagnoses:  Lower urinary tract infectious disease  Dysuria    New Prescriptions New Prescriptions   HYDROCODONE-ACETAMINOPHEN (NORCO/VICODIN) 5-325 MG TABLET    Take 2 tablets by mouth every 4 (four) hours as needed.   PHENAZOPYRIDINE (PYRIDIUM) 200 MG TABLET    Take 1 tablet (200 mg total) by mouth 3 (three) times daily.   SULFAMETHOXAZOLE-TRIMETHOPRIM (BACTRIM DS,SEPTRA DS) 800-160 MG TABLET    Take 1 tablet by mouth 2 (two) times daily.     Earley Favor, NP 01/14/17 Jackey Loge    Glynn Octave, MD 01/14/17 [phone]

## 2017-01-14 NOTE — Telephone Encounter (Signed)
Please call and let her know that I will be at the Monday, June 11 Pap clinic 6 to 7:30pm  [facility] [address] [place] runs program, she can ask for me and I will see her. If that does not work out and I don't see her , she can have pap sent to our office and that is not a problem. There is a scholarship program for the mammograms if she needs information on that. thanks

## 2017-01-31 ENCOUNTER — Other Ambulatory Visit: Payer: Self-pay | Admitting: Obstetrics and Gynecology

## 2017-01-31 DIAGNOSIS — Z1231 Encounter for screening mammogram for malignant neoplasm of breast: Secondary | ICD-10-CM

## 2017-02-13 ENCOUNTER — Other Ambulatory Visit: Payer: Self-pay | Admitting: Obstetrics and Gynecology

## 2017-02-13 ENCOUNTER — Ambulatory Visit
Admission: RE | Admit: 2017-02-13 | Discharge: 2017-02-13 | Disposition: A | Discharge status: Home / Self Care | Payer: Self-pay | Source: Ambulatory Visit | Attending: Obstetrics and Gynecology | Admitting: Obstetrics and Gynecology

## 2017-02-13 ENCOUNTER — Ambulatory Visit (HOSPITAL_COMMUNITY)
Admission: RE | Admit: 2017-02-13 | Discharge: 2017-02-13 | Disposition: A | Discharge status: Home / Self Care | Payer: Self-pay | Source: Ambulatory Visit | Attending: Obstetrics and Gynecology | Admitting: Obstetrics and Gynecology

## 2017-02-13 ENCOUNTER — Encounter (HOSPITAL_COMMUNITY): Payer: Self-pay

## 2017-02-13 VITALS — BP 118/78 | Ht 65.0 in | Wt 146.2 lb

## 2017-02-13 DIAGNOSIS — Z1231 Encounter for screening mammogram for malignant neoplasm of breast: Secondary | ICD-10-CM

## 2017-02-13 DIAGNOSIS — Z1239 Encounter for other screening for malignant neoplasm of breast: Secondary | ICD-10-CM

## 2017-02-13 NOTE — Progress Notes (Signed)
No complaints today.   Pap Smear:  Pap smear not completed today. [facility] and normal with negative HPV. Per patient has no history of an abnormal Pap smear. Last Pap smear result is in EPIC.  Physical exam: Breasts Breasts symmetrical. No skin abnormalities bilateral breasts. No nipple retraction bilateral breasts. No nipple discharge bilateral breasts. No lymphadenopathy. No lumps palpated bilateral breasts. No complaints of pain or tenderness on exam. [facility] screening mamm[facility]. Appointment scheduled for Thursday, February 13, 2017 at 1510.        Pelvic/Bimanual No Pap smear completed today since last Pap smear and HPV typing was 03/08/2015. Pap smear not indicated per BCCCP guidelines.   Smoking History: Patient has never smoked.  Patient Navigation: Patient education provided. Access to services provided for [facility] through [facility].

## 2017-02-13 NOTE — Patient Instructions (Signed)
Explained breast self awareness with Leslie Keller. Patient did not need a Pap smear today due to last Pap smear and HPV typing was 03/08/2015. Let her know BCCCP will cover Pap smears and HPV typing every 5 years unless has a history of abnormal Pap smears. [facility]. Appointment scheduled for Thursday, February 13, 2017 at 1510.  Let patient know the Breast Center will follow up with her within the next couple weeks with results of mammogram by letter or phone. Leslie Keller verbalized understanding.  Lyanne Kates, Kathaleen Maserhristine Poll, RN 3:56 PM

## 2017-02-17 ENCOUNTER — Encounter (HOSPITAL_COMMUNITY): Payer: Self-pay | Admitting: *Deleted

## 2021-10-31 ENCOUNTER — Other Ambulatory Visit: Payer: Self-pay | Admitting: Obstetrics & Gynecology

## 2021-10-31 DIAGNOSIS — Z1231 Encounter for screening mammogram for malignant neoplasm of breast: Secondary | ICD-10-CM

## 2021-12-20 ENCOUNTER — Ambulatory Visit
Admission: RE | Admit: 2021-12-20 | Discharge: 2021-12-20 | Disposition: A | Discharge status: Home / Self Care | Payer: No Typology Code available for payment source | Source: Ambulatory Visit | Attending: Obstetrics & Gynecology | Admitting: Obstetrics & Gynecology

## 2021-12-20 DIAGNOSIS — Z1231 Encounter for screening mammogram for malignant neoplasm of breast: Secondary | ICD-10-CM

## 2024-02-29 IMAGING — MG DIGITAL SCREENING BREAST BILAT IMPLANT W/ TOMO W/ CAD
8 of 16 series · 8 of 40 positions shown · non-contrast
Comparison: Previous exam(s).

CLINICAL DATA: Screening.

EXAM:
DIGITAL SCREENING BILATERAL MAMMOGRAM WITH IMPLANTS, CAD AND
TOMOSYNTHESIS
TECHNIQUE: Bilateral screening digital craniocaudal and mediolateral oblique
mammograms were obtained. Bilateral screening digital breast
tomosynthesis was performed. The images were evaluated with
computer-aided detection. Standard and/or implant displaced views
were performed.

[R MLO]
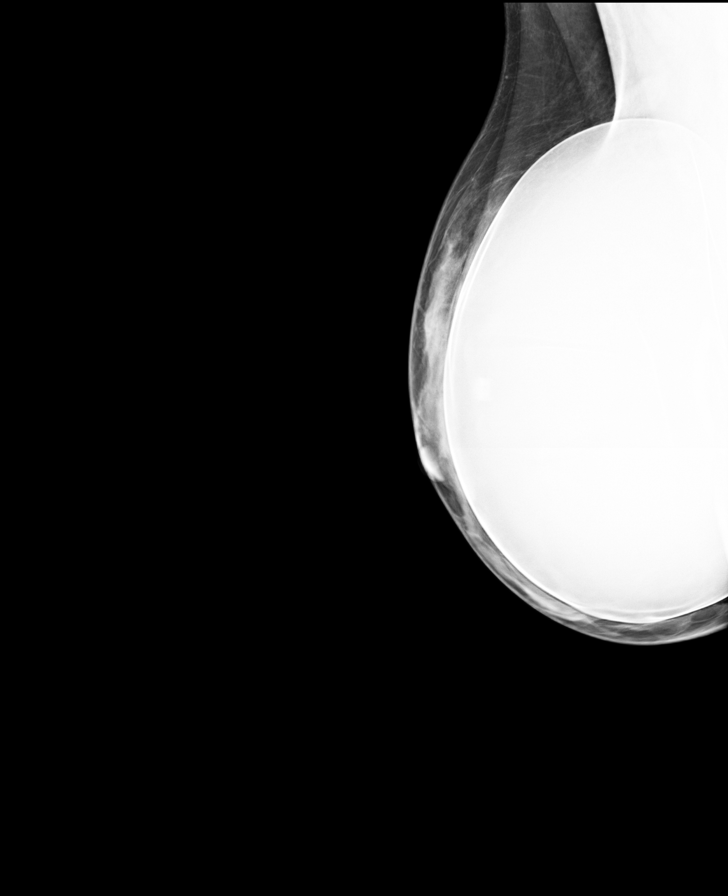

[L MLO]
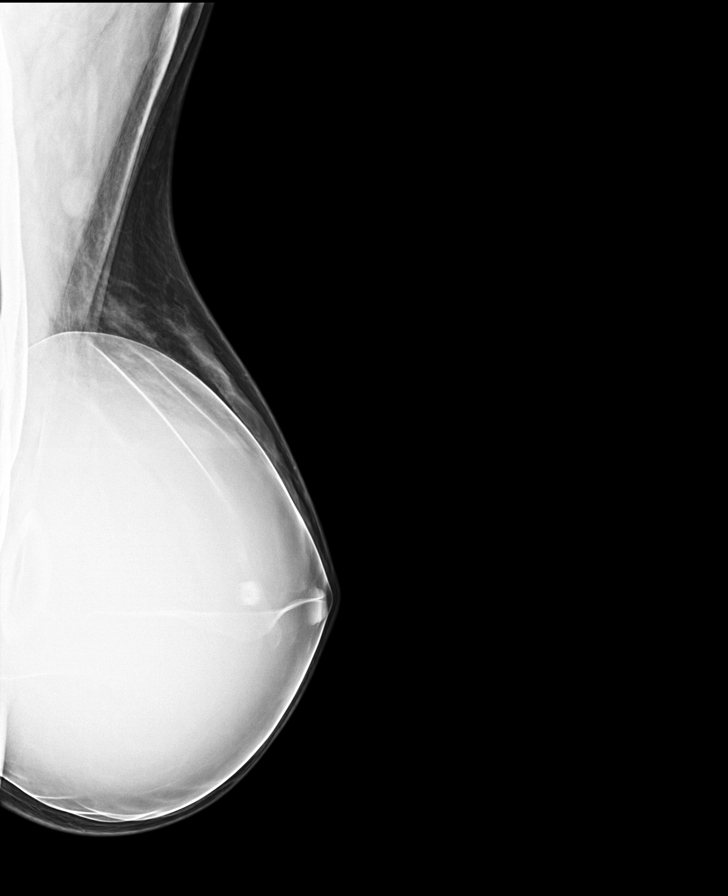

[L CC]
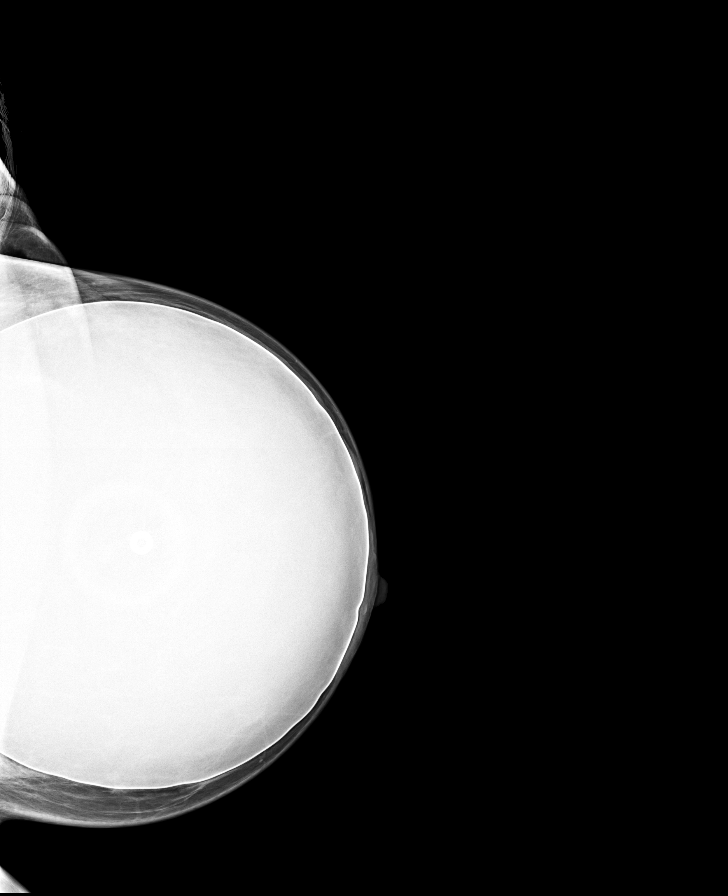

[R CC]
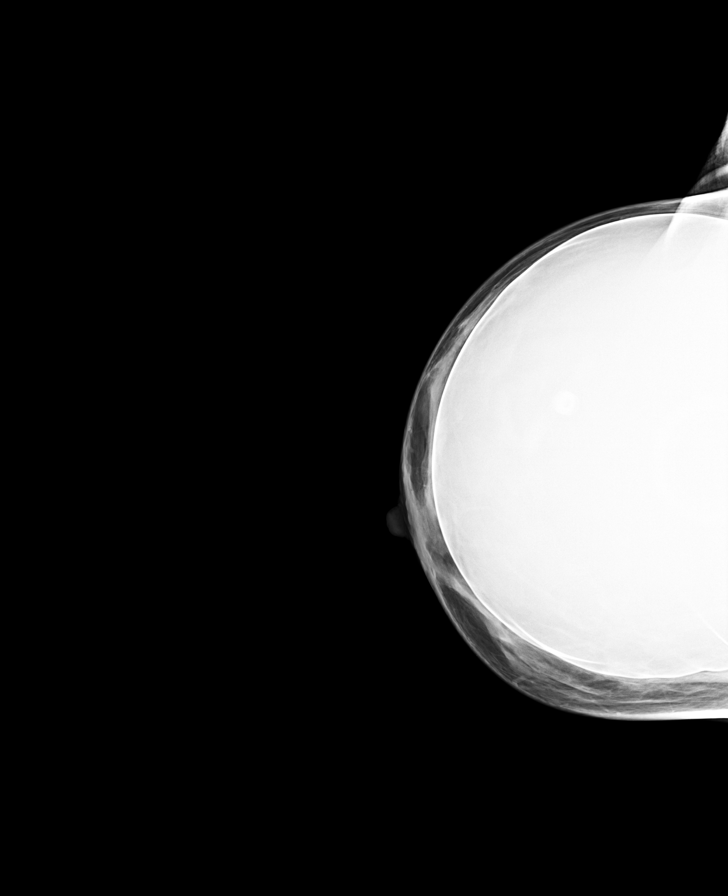

[R MLO synth-2D]
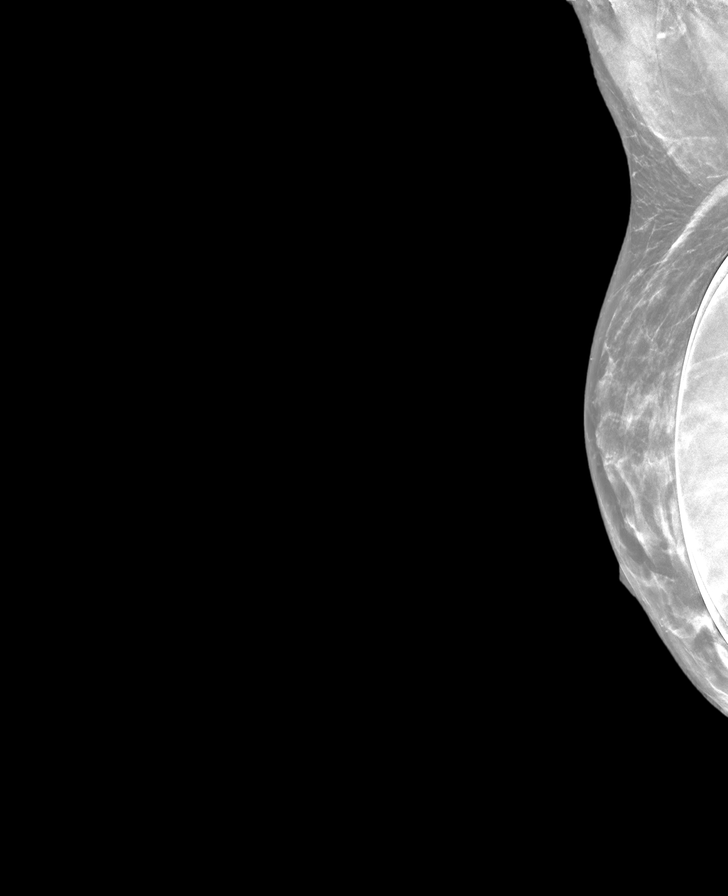

[L CC synth-2D]
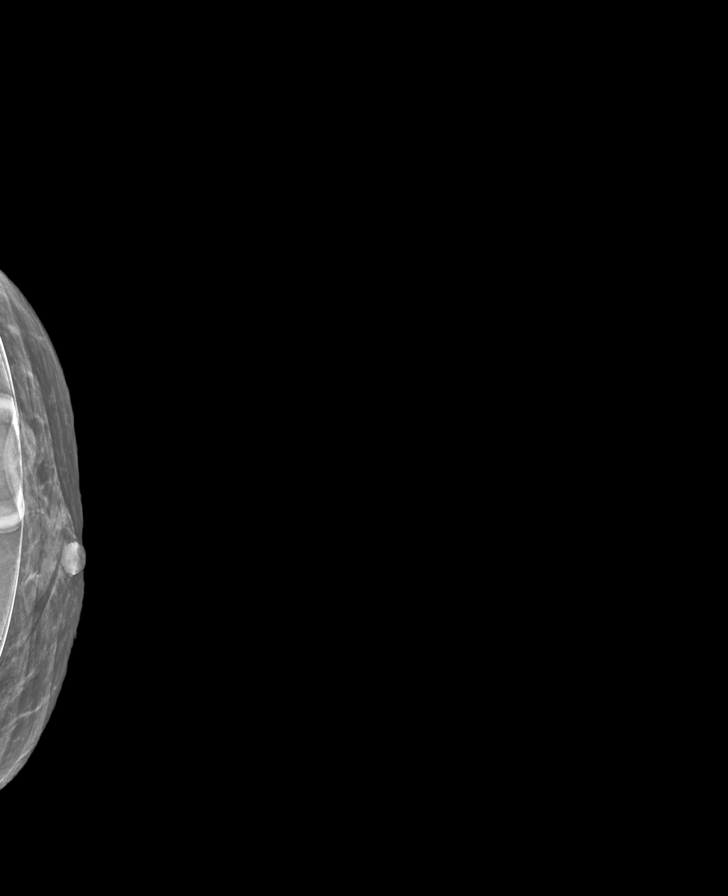

[L MLO synth-2D]
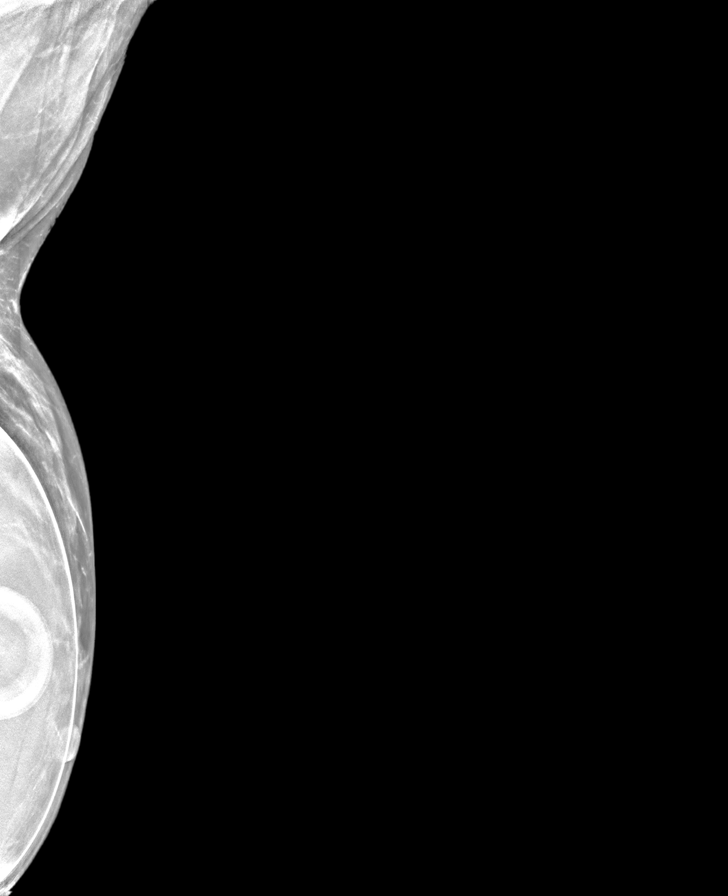

[R CC synth-2D]
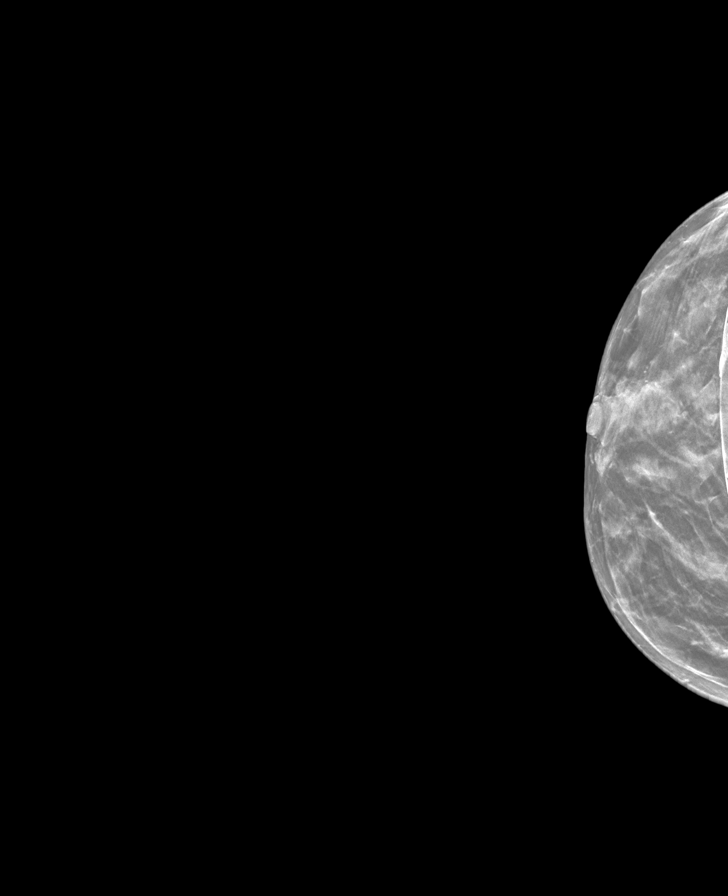

[8 of 40 positions shown; findings below may reference images not displayed]

ACR Breast Density Category c: The breast tissue is heterogeneously
dense, which may obscure small masses.
FINDINGS: The patient has retroglandular implants. There are no findings
suspicious for malignancy.
IMPRESSION: No mammographic evidence of malignancy. A result letter of this
screening mammogram will be mailed directly to the patient.

RECOMMENDATION:
Screening mammogram in one year. (Code:JF-9-JC8)

BI-RADS CATEGORY  1:  Negative.
